# Patient Record
Sex: Female | Born: 1980 | Race: Black or African American | Hispanic: No | Marital: Single | Smoking: Never smoker
Health system: Southern US, Community
[De-identification: ages and names within clinical notes are randomized; demographics above are authoritative.]

## PROBLEM LIST (undated history)

## (undated) DIAGNOSIS — N83299 Other ovarian cyst, unspecified side: Secondary | ICD-10-CM

## (undated) DIAGNOSIS — I10 Essential (primary) hypertension: Secondary | ICD-10-CM

## (undated) DIAGNOSIS — N920 Excessive and frequent menstruation with regular cycle: Secondary | ICD-10-CM

## (undated) DIAGNOSIS — N809 Endometriosis, unspecified: Secondary | ICD-10-CM

## (undated) DIAGNOSIS — E8881 Metabolic syndrome: Secondary | ICD-10-CM

## (undated) DIAGNOSIS — E88819 Insulin resistance, unspecified: Secondary | ICD-10-CM

## (undated) DIAGNOSIS — IMO0002 Reserved for concepts with insufficient information to code with codable children: Secondary | ICD-10-CM

## (undated) DIAGNOSIS — D649 Anemia, unspecified: Secondary | ICD-10-CM

## (undated) HISTORY — DX: Insulin resistance, unspecified: E88.819

## (undated) HISTORY — DX: Endometriosis, unspecified: N80.9

## (undated) HISTORY — DX: Metabolic syndrome: E88.81

## (undated) HISTORY — DX: Anemia, unspecified: D64.9

## (undated) HISTORY — DX: Other ovarian cyst, unspecified side: N83.299

## (undated) HISTORY — DX: Reserved for concepts with insufficient information to code with codable children: IMO0002

---

## 2002-12-12 ENCOUNTER — Other Ambulatory Visit: Admission: RE | Admit: 2002-12-12 | Discharge: 2002-12-12 | Payer: Self-pay | Admitting: Obstetrics and Gynecology

## 2003-01-03 HISTORY — PX: OVARIAN CYST REMOVAL: SHX89

## 2003-04-06 ENCOUNTER — Ambulatory Visit (HOSPITAL_COMMUNITY): Admission: RE | Admit: 2003-04-06 | Discharge: 2003-04-06 | Payer: Self-pay | Admitting: Obstetrics and Gynecology

## 2003-04-13 ENCOUNTER — Ambulatory Visit (HOSPITAL_COMMUNITY): Admission: RE | Admit: 2003-04-13 | Discharge: 2003-04-13 | Payer: Self-pay | Admitting: Obstetrics and Gynecology

## 2003-05-19 ENCOUNTER — Ambulatory Visit (HOSPITAL_COMMUNITY): Admission: RE | Admit: 2003-05-19 | Discharge: 2003-05-19 | Payer: Self-pay | Admitting: Obstetrics and Gynecology

## 2004-01-03 HISTORY — PX: OTHER SURGICAL HISTORY: SHX169

## 2004-03-15 ENCOUNTER — Other Ambulatory Visit: Admission: RE | Admit: 2004-03-15 | Discharge: 2004-03-15 | Payer: Self-pay | Admitting: Obstetrics and Gynecology

## 2005-03-02 DIAGNOSIS — N809 Endometriosis, unspecified: Secondary | ICD-10-CM

## 2005-03-02 HISTORY — DX: Endometriosis, unspecified: N80.9

## 2005-03-20 ENCOUNTER — Other Ambulatory Visit: Admission: RE | Admit: 2005-03-20 | Discharge: 2005-03-20 | Payer: Self-pay | Admitting: Obstetrics and Gynecology

## 2005-11-25 IMAGING — CT CT ABDOMEN W/ CM
1 of 2 series · 14 of 32 positions shown, 18 images · IV contrast ([ID] READICAT & [ID] OMNI/300)
Comparison: none

CLINICAL DATA: Pelvic mass identified on recent ultrasound.  Evaluate.
CT OF THE ABDOMEN AND PELVIS WITH CONTRAST:
Following the administration of oral and 100 cc of Omnipaque 300 IV, spiral CT was utilized from the dome of the diaphragm to the pubic symphysis.  Both lung and soft tissue windows were obtained.  Delayed images to evaluate the kidneys and bladder filling were also obtained.
ABDOMEN CT:
The lung bases appear clear.  
The liver, spleen, pancreas, adrenal glands, gallbladder and kidneys all have a normal post-contrasted appearance.  Adequate proximal bowel contrast was achieved and no focal bowel abnormalities are noted.  No intraabdominal fluid or adenopathy is noted.  
IMPRESSION
Normal abdominal CT
PELVIC CT:
Good distal bowel contrast was achieved and no focal abnormalities are identified.  The finding of note is a pelvic mass identified which is located just superior and posterior to the uterine corpus.  This measures 9.2 x 4.6 x approximately 5.6 cm in size.  This has an appearance which correlates with the ultrasound finding of two primarily cystic areas with diffuse low level echoes and a central area which has higher attenuation.  No areas of high attenuation to suggest Intralesional blood are seen as might be helpful to confirm that this an atypical endometrioma.  No separate area is seen to suggest the left ovary and raises the possibility that this is ovarian in etiology.  No signs of perilesional inflammatory change are noted to suggest that this an inflammatory process.  No pelvic fluid or definite associated adenopathy is seen.  In the right adnexa the right ovary is seen and contains a small cystic area which would correlate with the ultrasound finding on this side on 04/06/03.  The tubular fluid-filled structure seen on ultrasound on the right side is not appreciated with confidence on the CT.  Bladder and distal ureters appear unremarkable.  
Pelvic mass with the epicenter located slightly to the left.  The position and lack of visualization of a left ovary suggests that this may be ovarian in etiology. The overall appearance correlates with recent ultrasound appearance.  No areas of increased attenuation are identified within this to suggest acute hemorrhage or acute blood as might be seen with an endometrioma or acute hemorrhagic cyst.  An area of higher attenuation in the central portion of the mass is seen and correlates with the area of increased echogenicity on the ultrasound.  It remains possible that this could represent an endometrioma with an area of more recent clot centrally and more typical chocolate cyst contents in the remainder of the mass.  An atypical hemorrhagic cyst would be a secondary consideration.  Lack of adjacent inflammatory changes would mitigate against this representing an inflammatory process or ovarian torsion.  An ovarian neoplastic process would certainly need to be considered as well.  If this indeed represents a neoplastic process, no signs to suggest extra-ovarian extension are apparent by CT criteria.

[Series 2: — · axial · 0.63mm/px · z∈[-504,-94]mm · 14 of 137 slices shown, 18 images]
[im 11/137  soft-tissue]
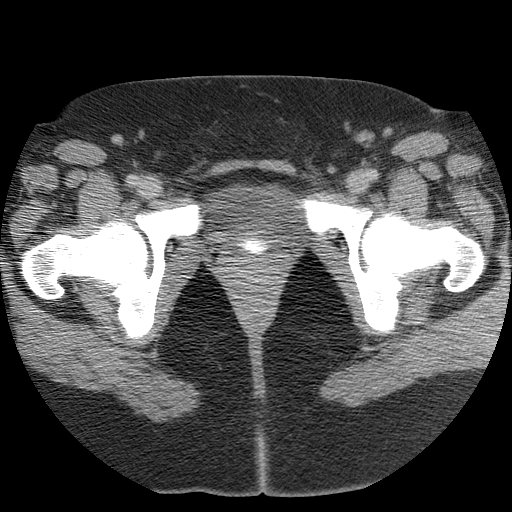
[im 11/137  bone]
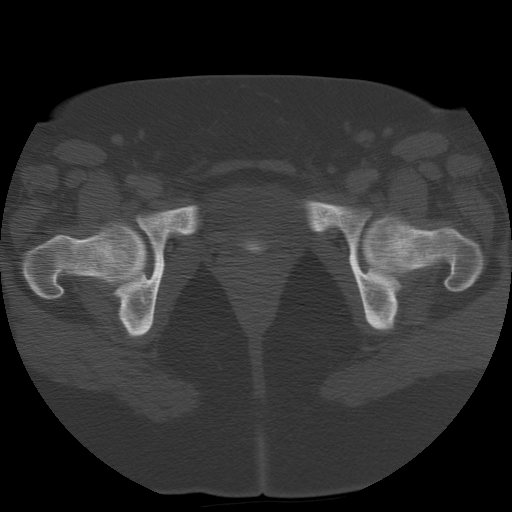
[im 21/137  soft-tissue]
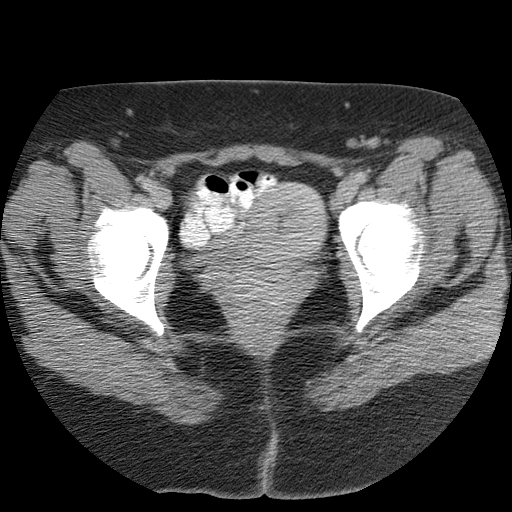
[im 31/137  soft-tissue]
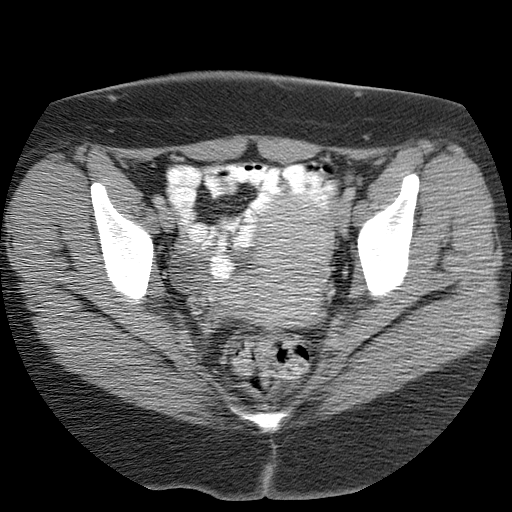
[im 41/137  soft-tissue]
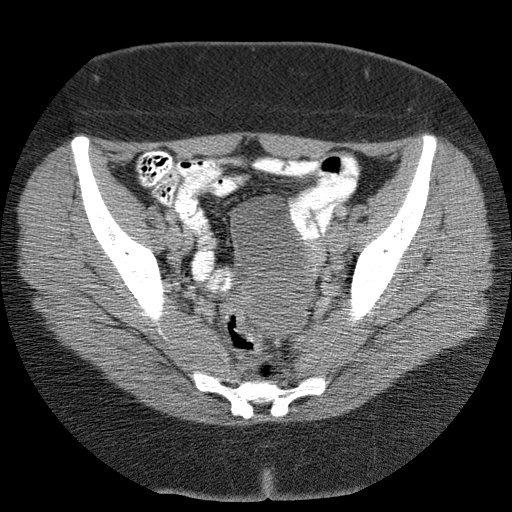
[im 51/137  soft-tissue]
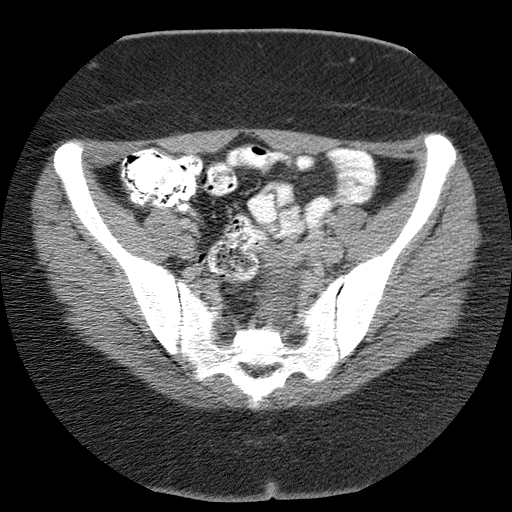
[im 61/137  soft-tissue]
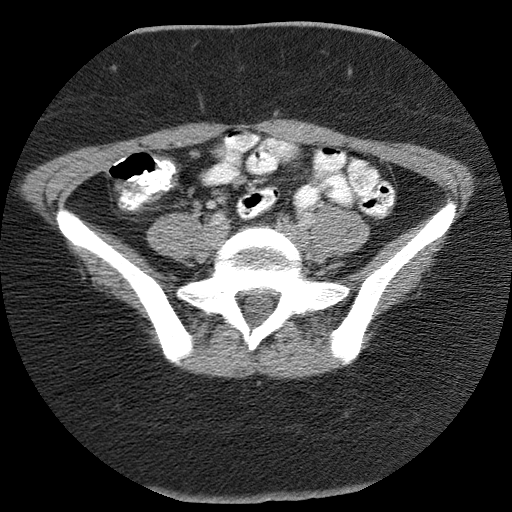
[im 76/137  soft-tissue]
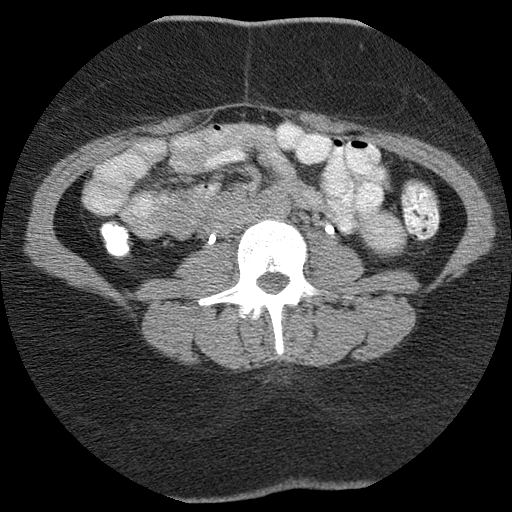
[im 86/137  soft-tissue]
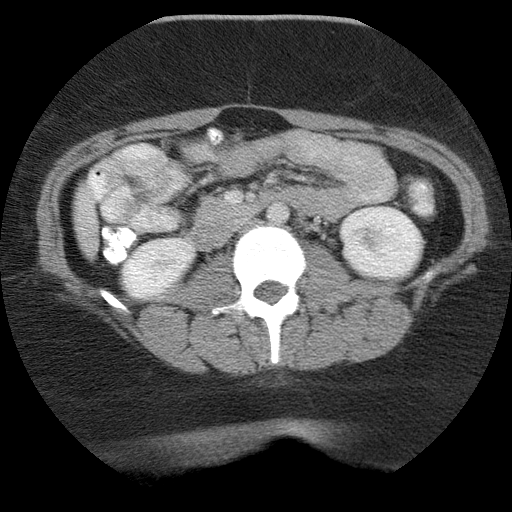
[im 96/137  soft-tissue]
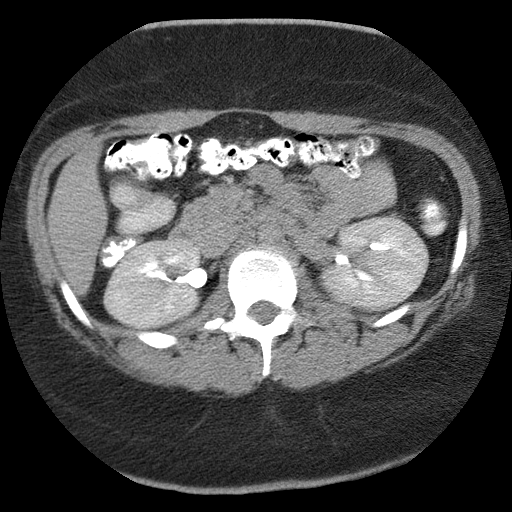
[im 96/137  bone]
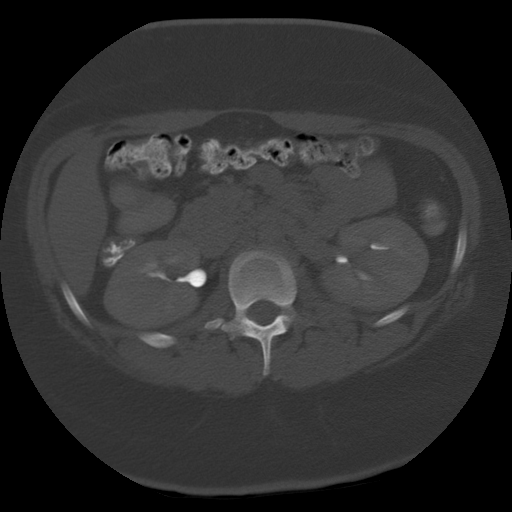
[im 106/137  soft-tissue]
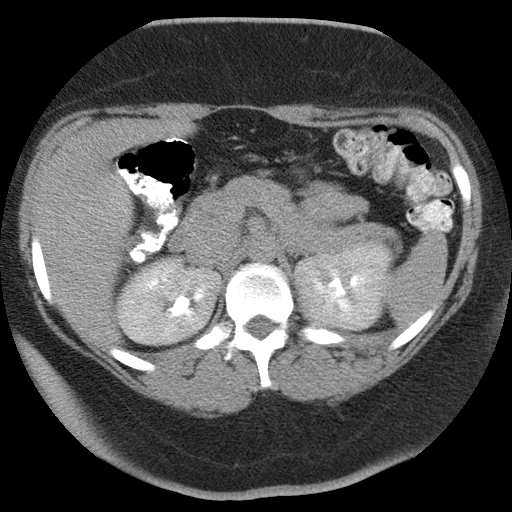
[im 116/137  soft-tissue]
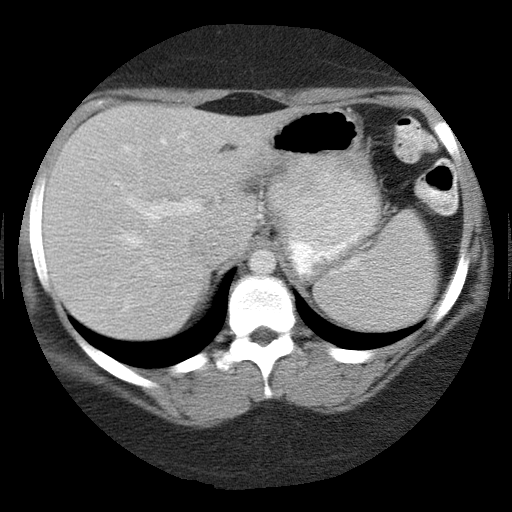
[im 116/137  lung]
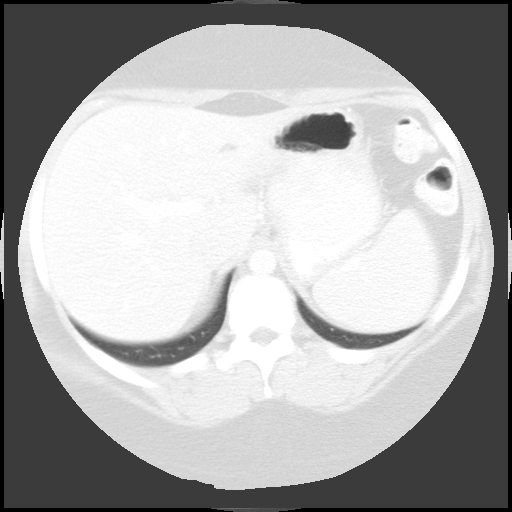
[im 121/137  lung]
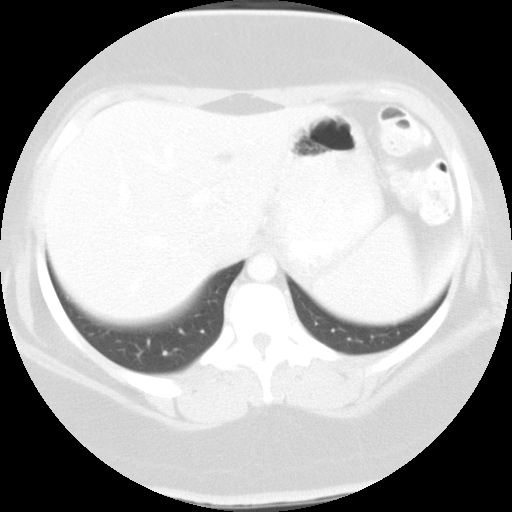
[im 126/137  soft-tissue]
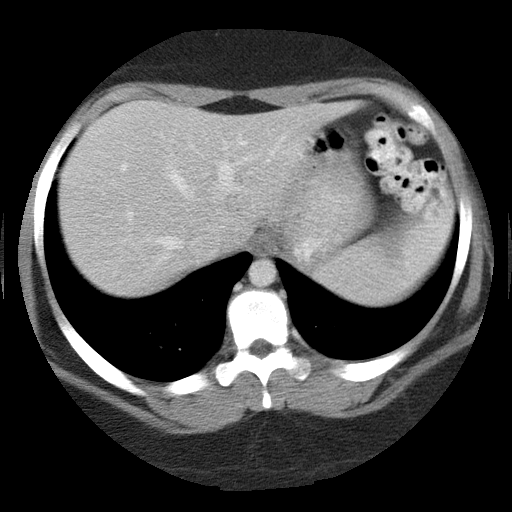
[im 126/137  lung]
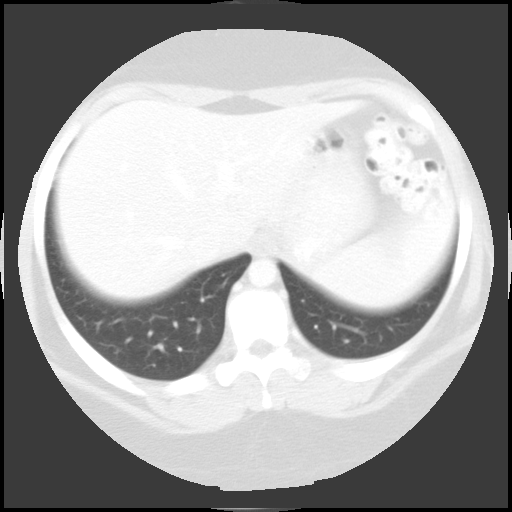
[im 131/137  lung]
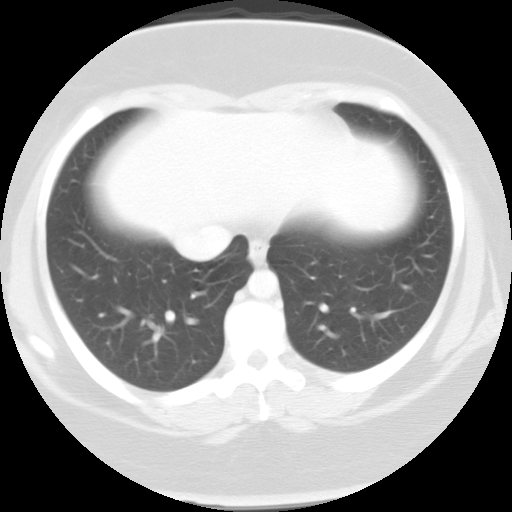

[14 of 32 positions shown; findings below may reference images not displayed]

## 2006-04-27 ENCOUNTER — Encounter: Admission: RE | Admit: 2006-04-27 | Discharge: 2006-04-27 | Payer: Self-pay | Admitting: Obstetrics and Gynecology

## 2009-03-02 DIAGNOSIS — IMO0002 Reserved for concepts with insufficient information to code with codable children: Secondary | ICD-10-CM

## 2009-03-02 DIAGNOSIS — R87619 Unspecified abnormal cytological findings in specimens from cervix uteri: Secondary | ICD-10-CM

## 2009-03-02 HISTORY — DX: Unspecified abnormal cytological findings in specimens from cervix uteri: R87.619

## 2009-03-02 HISTORY — DX: Reserved for concepts with insufficient information to code with codable children: IMO0002

## 2011-08-15 ENCOUNTER — Ambulatory Visit (INDEPENDENT_AMBULATORY_CARE_PROVIDER_SITE_OTHER): Payer: BC Managed Care – PPO | Admitting: Obstetrics and Gynecology

## 2011-08-15 ENCOUNTER — Encounter: Payer: Self-pay | Admitting: Obstetrics and Gynecology

## 2011-08-15 VITALS — BP 122/64 | HR 70 | Resp 16 | Ht 74.0 in | Wt 310.0 lb

## 2011-08-15 DIAGNOSIS — N809 Endometriosis, unspecified: Secondary | ICD-10-CM

## 2011-08-15 DIAGNOSIS — R87811 Vaginal high risk human papillomavirus (HPV) DNA test positive: Secondary | ICD-10-CM

## 2011-08-15 DIAGNOSIS — IMO0002 Reserved for concepts with insufficient information to code with codable children: Secondary | ICD-10-CM

## 2011-08-15 DIAGNOSIS — Z124 Encounter for screening for malignant neoplasm of cervix: Secondary | ICD-10-CM

## 2011-08-15 MED ORDER — LEVONORGEST-ETH ESTRAD 91-DAY 0.15-0.03 MG PO TABS: 1.0000 | ORAL_TABLET | Freq: Every day | ORAL | Status: DC

## 2011-08-15 NOTE — Patient Instructions (Signed)

## 2011-08-15 NOTE — Progress Notes (Signed)
Last Pap: 07/13/2010 WNL: Yes Regular Periods:yes Contraception: BC Pill  Monthly Breast exam:yes Tetanus<71yrs:yes Nl.Bladder Function:yes Daily BMs:no Healthy Diet:no Calcium:no Mammogram:no Date of Mammogram: NA Exercise:no Have often Exercise: No Seatbelt: yes Abuse at home: no Stressful work:yes Sigmoid-colonoscopy: None Bone Density: No PCP: Dr Allyne Gee Change in PMH: no change Change in FMH:no change BP 122/64  Pulse 70  Resp 16  Ht 6\' 2"  (1.88 m)  Wt 310 lb (140.615 kg)  BMI 39.80 kg/m2  LMP 05/26/2011 Pt without complaints Physical Examination: General appearance - alert, well appearing, and in no distress Mental status - normal mood, behavior, speech, dress, motor activity, and thought processes Neck - supple, no significant adenopathy, thyroid exam: thyroid is normal in size without nodules or tenderness Chest - clear to auscultation, no wheezes, rales or rhonchi, symmetric air entry Heart - normal rate and regular rhythm Abdomen - soft, nontender, nondistended, no masses or organomegaly Breasts - breasts appear normal, no suspicious masses, no skin or nipple changes or axillary nodes Pelvic - normal external genitalia, vulva, vagina, cervix, uterus and adnexa Rectal - normal rectal, no masses, rectal exam not indicated Back exam - full range of motion, no tenderness, palpable spasm or pain on motion Neurological - alert, oriented, normal speech, no focal findings or movement disorder noted Musculoskeletal - no joint tenderness, deformity or swelling Extremities - no edema, redness or tenderness in the calves or thighs Skin - normal coloration and turgor, no rashes, no suspicious skin lesions noted Routine exam Pap sent yes Mammogram due no rx for seasonale sent to pharmacy RT 1 yr

## 2011-08-17 LAB — PAP IG W/ RFLX HPV ASCU

## 2011-08-28 ENCOUNTER — Telehealth: Payer: Self-pay

## 2011-08-28 MED ORDER — METRONIDAZOLE 500 MG PO TABS: 500.0000 mg | ORAL_TABLET | Freq: Two times a day (BID) | ORAL | Status: AC

## 2011-08-28 NOTE — Telephone Encounter (Signed)
Spoke with pt rgs labs informed pap wnl but showed bv need rx informed rx sent to pharm pt voice understanding

## 2011-08-28 NOTE — Telephone Encounter (Signed)
Message copied by Rolla Plate on Mon Aug 28, 2011  9:49 AM ------      Message from: Jaymes Graff      Created: Fri Aug 25, 2011 10:55 PM       Please tell pt BV was found on her pap smear.  She can be treated with either Metrogel one applicator in vagina QHS for five nights or Flagyl 500mg  one tablet twice a day for seven days.

## 2012-02-24 ENCOUNTER — Other Ambulatory Visit: Payer: Self-pay | Admitting: Obstetrics and Gynecology

## 2013-06-11 ENCOUNTER — Ambulatory Visit (INDEPENDENT_AMBULATORY_CARE_PROVIDER_SITE_OTHER): Payer: BC Managed Care – PPO | Admitting: Emergency Medicine

## 2013-06-11 VITALS — BP 140/90 | HR 84 | Temp 98.0°F | Ht 73.5 in | Wt 336.0 lb

## 2013-06-11 DIAGNOSIS — H6120 Impacted cerumen, unspecified ear: Secondary | ICD-10-CM

## 2013-06-11 DIAGNOSIS — H60399 Other infective otitis externa, unspecified ear: Secondary | ICD-10-CM

## 2013-06-11 DIAGNOSIS — H9209 Otalgia, unspecified ear: Secondary | ICD-10-CM

## 2013-06-11 MED ORDER — NEOMYCIN-POLYMYXIN-HC 3.5-10000-1 OT SOLN: 4.0000 [drp] | Freq: Four times a day (QID) | OTIC | Status: DC

## 2013-06-11 NOTE — Progress Notes (Signed)
[facility] [address] [place] [address] [phone]  Date:  06/11/2013   Name:  Adrienne Benjamin   DOB:  1980-08-07   MRN:  [id number]  PCP:  [name], MD    Chief Complaint: left ear pain   History of Present Illness:  Adrienne Benjamin is a 33 y.o. very pleasant female patient who presents with the following:  Saturday developed pain in the left ear.  Has instrumented her ear since and the pain is worse since.  No fever or chills.  No coryza or cough.  Now has a sore throat.  No improvement with over the counter medications or other home remedies. Denies other complaint or health concern today.   Patient Active Problem List   Diagnosis Date Noted  . Endometriosis 08/15/2011  . 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 Abnormal Pap smear 3/11    ascus/ hpv  . Endometriosis 3/07  . Anemia   . Complex ovarian cyst   . Insulin resistance     No past surgical history on file.  History  Substance Use Topics  . Smoking status: Never Smoker   . Smokeless tobacco: Not on file  . Alcohol Use: No    Family History  Problem Relation Age of Onset  . Diabetes Father     No Known Allergies  Medication list has been reviewed and updated.  Current Outpatient Prescriptions on File Prior to Visit  Medication Sig Dispense Refill  . ferrous sulfate 325 (65 FE) MG tablet Take 325 mg by mouth daily with breakfast.      . folic acid (FOLVITE) 1 MG tablet TAKE ONE TABLET BY MOUTH EVERY DAY  30 tablet  0  . hydrochlorothiazide (MICROZIDE) 12.5 MG capsule Take 12.5 mg by mouth daily.      Marland Kitchen levocetirizine (XYZAL) 5 MG tablet Take 5 mg by mouth every evening.      Marland Kitchen levonorgestrel-ethinyl estradiol (SEASONALE,INTROVALE,JOLESSA) 0.15-0.03 MG tablet Take 1 tablet by mouth daily.  1 Package  4  . phentermine 37.5 MG capsule Take 37.5 mg by mouth every morning.      . Prenatal Vit-Fe Fumarate-FA (PRENATAL  MULTIVITAMIN) TABS Take 1 tablet by mouth daily.       No current facility-administered medications on file prior to visit.    Review of Systems:  As per HPI, otherwise negative.    Physical Examination: Filed Vitals:   06/11/13 1951  BP: 140/90  Pulse: 84  Temp: 98 F (36.7 C)   Filed Vitals:   06/11/13 1951  Height: 6' 1.5" (1.867 m)  Weight: 336 lb (152.409 kg)   Body mass index is 43.72 kg/(m^2). Ideal Body Weight: Weight in (lb) to have BMI = 25: 191.7   GEN: WDWN, NAD, Non-toxic, Alert & Oriented x 3 HEENT: Atraumatic, Normocephalic.  Ears and Nose: No external deformity.  Both ears exhibit excessive cerumen EXTR: No clubbing/cyanosis/edema NEURO: Normal gait.  PSYCH: Normally interactive. Conversant. Not depressed or anxious appearing.  Calm demeanor.    Assessment and Plan: Swimmers ear Excessive cerumen Cortisporin   Signed,  Phillips Odor, MD

## 2013-06-11 NOTE — Patient Instructions (Signed)
Otitis Externa Otitis externa is a bacterial or fungal infection of the outer ear canal. This is the area from the eardrum to the outside of the ear. Otitis externa is sometimes called "swimmer's ear." CAUSES  Possible causes of infection include:  Swimming in dirty water.  Moisture remaining in the ear after swimming or bathing.  Mild injury (trauma) to the ear.  Objects stuck in the ear (foreign body).  Cuts or scrapes (abrasions) on the outside of the ear. SYMPTOMS  The first symptom of infection is often itching in the ear canal. Later signs and symptoms may include swelling and redness of the ear canal, ear pain, and yellowish-white fluid (pus) coming from the ear. The ear pain may be worse when pulling on the earlobe. DIAGNOSIS  Your caregiver will perform a physical exam. A sample of fluid may be taken from the ear and examined for bacteria or fungi. TREATMENT  Antibiotic ear drops are often given for 10 to 14 days. Treatment may also include pain medicine or corticosteroids to reduce itching and swelling. PREVENTION   Keep your ear dry. Use the corner of a towel to absorb water out of the ear canal after swimming or bathing.  Avoid scratching or putting objects inside your ear. This can damage the ear canal or remove the protective wax that lines the canal. This makes it easier for bacteria and fungi to grow.  Avoid swimming in lakes, polluted water, or poorly chlorinated pools.  You may use ear drops made of rubbing alcohol and vinegar after swimming. Combine equal parts of white vinegar and alcohol in a bottle. Put 3 or 4 drops into each ear after swimming. HOME CARE INSTRUCTIONS   Apply antibiotic ear drops to the ear canal as prescribed by your caregiver.  Only take over-the-counter or prescription medicines for pain, discomfort, or fever as directed by your caregiver.  If you have diabetes, follow any additional treatment instructions from your caregiver.  Keep all  follow-up appointments as directed by your caregiver. SEEK MEDICAL CARE IF:   You have a fever.  Your ear is still red, swollen, painful, or draining pus after 3 days.  Your redness, swelling, or pain gets worse.  You have a severe headache.  You have redness, swelling, pain, or tenderness in the area behind your ear. MAKE SURE YOU:   Understand these instructions.  Will watch your condition.  Will get help right away if you are not doing well or get worse. Document Released: 12/19/2004 Document Revised: 03/13/2011 Document Reviewed: 01/05/2011 [employer] Patient Information 2014 [place], [place]. Cerumen Impaction A cerumen impaction is when the wax in your ear forms a plug. This plug usually causes reduced hearing. Sometimes it also causes an earache or dizziness. Removing a cerumen impaction can be difficult and painful. The wax sticks to the ear canal. The canal is sensitive and bleeds easily. If you try to remove a heavy wax buildup with a cotton tipped swab, you may push it in further. Irrigation with water, suction, and small ear curettes may be used to clear out the wax. If the impaction is fixed to the skin in the ear canal, ear drops may be needed for a few days to loosen the wax. People who build up a lot of wax frequently can use ear wax removal products available in your local drugstore. SEEK MEDICAL CARE IF:  You develop an earache, increased hearing loss, or marked dizziness. Document Released: 01/27/2004 Document Revised: 03/13/2011 Document Reviewed: 03/18/2009 [employer] Patient  Information 2014 [employer].  

## 2017-09-19 LAB — HM PAP SMEAR

## 2017-10-01 DIAGNOSIS — T8130XS Disruption of wound, unspecified, sequela: Secondary | ICD-10-CM

## 2017-10-29 ENCOUNTER — Encounter (HOSPITAL_BASED_OUTPATIENT_CLINIC_OR_DEPARTMENT_OTHER): Payer: Self-pay | Attending: Internal Medicine

## 2017-12-03 ENCOUNTER — Other Ambulatory Visit: Payer: Self-pay | Admitting: Internal Medicine

## 2018-03-27 ENCOUNTER — Encounter: Payer: Self-pay | Admitting: Internal Medicine

## 2018-05-24 ENCOUNTER — Encounter: Payer: Self-pay | Admitting: Internal Medicine

## 2018-06-30 ENCOUNTER — Other Ambulatory Visit: Payer: Self-pay | Admitting: Internal Medicine

## 2018-09-16 ENCOUNTER — Other Ambulatory Visit: Payer: Self-pay | Admitting: Internal Medicine

## 2018-09-16 ENCOUNTER — Encounter: Payer: Self-pay | Admitting: Internal Medicine

## 2018-09-16 ENCOUNTER — Ambulatory Visit (INDEPENDENT_AMBULATORY_CARE_PROVIDER_SITE_OTHER): Payer: BC Managed Care – PPO | Admitting: Internal Medicine

## 2018-09-16 ENCOUNTER — Other Ambulatory Visit: Payer: Self-pay

## 2018-09-16 VITALS — BP 148/80 | HR 78 | Temp 98.5°F | Ht 72.0 in | Wt 367.8 lb

## 2018-09-16 DIAGNOSIS — I10 Essential (primary) hypertension: Secondary | ICD-10-CM

## 2018-09-16 DIAGNOSIS — H6123 Impacted cerumen, bilateral: Secondary | ICD-10-CM | POA: Diagnosis not present

## 2018-09-16 DIAGNOSIS — Z0000 Encounter for general adult medical examination without abnormal findings: Secondary | ICD-10-CM

## 2018-09-16 MED ORDER — NIFEDIPINE ER OSMOTIC RELEASE 30 MG PO TB24: 30.0000 mg | ORAL_TABLET | Freq: Every day | ORAL | 1 refills | Status: DC

## 2018-09-16 NOTE — Progress Notes (Signed)
Subjective:     Patient ID: Adrienne Benjamin , female    DOB: 1980-02-15 , 38 y.o.   MRN: [id number]   Chief Complaint  Patient presents with  . Annual Exam    HPI  She is here today for a full physical examination. She has her pap smears performed by Dr. Charlesetta Garibaldi. She is scheduled for her next pap smear next week. She is currently trying to conceive. She is taking prenatal vitamins.     Past Medical History:  Diagnosis Date  . Abnormal Pap smear 3/11   ascus/ hpv  . Anemia   . Complex ovarian cyst   . Endometriosis 3/07  . Insulin resistance      Family History  Problem Relation Age of Onset  . Diabetes Father      Current Outpatient Medications:  .  hydrochlorothiazide (MICROZIDE) 12.5 MG capsule, Take 1 capsule by mouth once daily, Disp: 90 capsule, Rfl: 0 .  Iron-Folic DHRC-B63-A-GTXMIWOE (FERRAPLUS 90) 90-1 MG TABS, TAKE 1 TABLET BY MOUTH ONCE DAILY, Disp: 10 tablet, Rfl: 5 .  levocetirizine (XYZAL) 5 MG tablet, Take 5 mg by mouth every evening., Disp: , Rfl:  .  neomycin-polymyxin-hydrocortisone (CORTISPORIN) otic solution, Place 4 drops into the left ear 4 (four) times daily., Disp: 10 mL, Rfl: 0 .  Nutritional Supplements (CONCEPTIONXR REPRODUCTIVE) TABS, Take by mouth., Disp: , Rfl:  .  Prenatal Vit-Fe Fumarate-FA (PRENATAL MULTIVITAMIN) TABS, Take 1 tablet by mouth daily., Disp: , Rfl:  .  ferrous sulfate 325 (65 FE) MG tablet, Take 325 mg by mouth daily with breakfast., Disp: , Rfl:  .  folic acid (FOLVITE) 1 MG tablet, TAKE ONE TABLET BY MOUTH EVERY DAY (Patient not taking: Reported on 09/16/2018), Disp: 30 tablet, Rfl: 0 .  NIFEdipine (PROCARDIA-XL/NIFEDICAL-XL) 30 MG 24 hr tablet, Take 1 tablet (30 mg total) by mouth daily., Disp: 30 tablet, Rfl: 1   No Known Allergies     The patient states she uses none for birth control. Last LMP was Patient's last menstrual period was 09/14/2018 (exact date).. Negative for Dysmenorrhea  Negative for: breast discharge,  breast lump(s), breast pain and breast self exam. Associated symptoms include abnormal vaginal bleeding. Pertinent negatives include abnormal bleeding (hematology), anxiety, decreased libido, depression, difficulty falling sleep, dyspareunia, history of infertility, nocturia, sexual dysfunction, sleep disturbances, urinary incontinence, urinary urgency, vaginal discharge and vaginal itching. Diet regular.The patient states her exercise level is  intermittent.  . The patient's tobacco use is:  Social History   Tobacco Use  Smoking Status Never Smoker  Smokeless Tobacco Never Used  . She has been exposed to passive smoke. The patient's alcohol use is:  Social History   Substance and Sexual Activity  Alcohol Use No     Review of Systems  Constitutional: Negative.   HENT: Negative.   Eyes: Negative.   Respiratory: Negative.   Cardiovascular: Negative.   Endocrine: Negative.   Genitourinary: Negative.   Musculoskeletal: Negative.   Skin: Negative.   Allergic/Immunologic: Negative.   Neurological: Negative.   Hematological: Negative.   Psychiatric/Behavioral: Negative.      Today's Vitals   09/16/18 1021  BP: (!) 148/80  Pulse: 78  Temp: 98.5 F (36.9 C)  TempSrc: Oral  SpO2: 97%  Weight: (!) 367 lb 12.8 oz (166.8 kg)  Height: 6' (1.829 m)   Body mass index is 49.88 kg/m.   Objective:  Physical Exam Vitals signs and nursing note reviewed.  Constitutional:      Appearance:  Normal appearance. She is obese.  HENT:     Head: Normocephalic and atraumatic.     Right Ear: Ear canal and external ear normal. There is impacted cerumen.     Left Ear: Ear canal and external ear normal. There is impacted cerumen.     Ears:     Comments: Cerumen is hardened and deep into the canal.     Nose: Nose normal.     Mouth/Throat:     Mouth: Mucous membranes are moist.     Pharynx: Oropharynx is clear.  Eyes:     Extraocular Movements: Extraocular movements intact.      Conjunctiva/sclera: Conjunctivae normal.     Pupils: Pupils are equal, round, and reactive to light.  Neck:     Musculoskeletal: Normal range of motion and neck supple.  Cardiovascular:     Rate and Rhythm: Normal rate and regular rhythm.     Pulses: Normal pulses.     Heart sounds: Normal heart sounds.  Pulmonary:     Effort: Pulmonary effort is normal.     Breath sounds: Normal breath sounds.  Chest:     Breasts: Tanner Score is 5.        Right: Normal. No swelling, bleeding, inverted nipple, mass, nipple discharge or skin change.        Left: Normal. No swelling, bleeding, inverted nipple, mass, nipple discharge or skin change.  Abdominal:     General: Bowel sounds are normal.     Palpations: Abdomen is soft.     Comments: Obese, difficult to assess organomegaly  Genitourinary:    Comments: deferred Musculoskeletal: Normal range of motion.  Skin:    General: Skin is warm and dry.  Neurological:     General: No focal deficit present.     Mental Status: She is alert and oriented to person, place, and time.  Psychiatric:        Mood and Affect: Mood normal.        Behavior: Behavior normal.         Assessment And Plan:     1. Routine general medical examination at health care facility  A full exam was performed.  Importance of monthly self breast exams was discussed with the patient. PATIENT HAS BEEN ADVISED TO GET 30-45 MINUTES REGULAR EXERCISE NO LESS THAN FOUR TO FIVE DAYS PER WEEK - BOTH WEIGHTBEARING EXERCISES AND AEROBIC ARE RECOMMENDED.  SHE WAS ADVISED TO FOLLOW A HEALTHY DIET WITH AT LEAST SIX FRUITS/VEGGIES PER DAY, DECREASE INTAKE OF RED MEAT, AND TO INCREASE FISH INTAKE TO TWO DAYS PER WEEK.  MEATS/FISH SHOULD NOT BE FRIED, BAKED OR BROILED IS PREFERABLE.  I SUGGEST WEARING SPF 50 SUNSCREEN ON EXPOSED PARTS AND ESPECIALLY WHEN IN THE DIRECT SUNLIGHT FOR AN EXTENDED PERIOD OF TIME.  PLEASE AVOID FAST FOOD RESTAURANTS AND INCREASE YOUR WATER INTAKE.  - CMP14+EGFR -  CBC - Lipid panel - Hemoglobin A1c - TSH  2. Essential hypertension, benign  Chronic, uncontrolled. She admits that she has not been taking regularly. Importance of medication compliance was discussed with the patient. I will also add nifedipine XL 30 mg once daily. She agrees to rto in six weeks for re-evaluation. EKG performed, no new changes noted. She is also advised to stop eating luncheon meats, increase her daily activity and to avoid adding salt to her foods.   - EKG 12-Lead  3. Bilateral impacted cerumen  AFTER OBTAINING VERBAL CONSENT, BOTH EARS WERE FLUSHED BY IRRIGATION. SHE TOLERATED PROCEDURE WELL  WITHOUT ANY COMPLICATIONS. NO TM ABNORMALITIES WERE NOTED.  - Ear Lavage        [name], MD    THE PATIENT IS ENCOURAGED TO PRACTICE SOCIAL DISTANCING DUE TO THE COVID-19 PANDEMIC.

## 2018-09-16 NOTE — Patient Instructions (Signed)
Health Maintenance, Female Adopting a healthy lifestyle and getting preventive care are important in promoting health and wellness. Ask your health care provider about:  The right schedule for you to have regular tests and exams.  Things you can do on your own to prevent diseases and keep yourself healthy. What should I know about diet, weight, and exercise? Eat a healthy diet   Eat a diet that includes plenty of vegetables, fruits, low-fat dairy products, and lean protein.  Do not eat a lot of foods that are high in solid fats, added sugars, or sodium. Maintain a healthy weight Body mass index (BMI) is used to identify weight problems. It estimates body fat based on height and weight. Your health care provider can help determine your BMI and help you achieve or maintain a healthy weight. Get regular exercise Get regular exercise. This is one of the most important things you can do for your health. Most adults should:  Exercise for at least 150 minutes each week. The exercise should increase your heart rate and make you sweat (moderate-intensity exercise).  Do strengthening exercises at least twice a week. This is in addition to the moderate-intensity exercise.  Spend less time sitting. Even light physical activity can be beneficial. Watch cholesterol and blood lipids Have your blood tested for lipids and cholesterol at 37 years of age, then have this test every 5 years. Have your cholesterol levels checked more often if:  Your lipid or cholesterol levels are high.  You are older than 37 years of age.  You are at high risk for heart disease. What should I know about cancer screening? Depending on your health history and family history, you may need to have cancer screening at various ages. This may include screening for:  Breast cancer.  Cervical cancer.  Colorectal cancer.  Skin cancer.  Lung cancer. What should I know about heart disease, diabetes, and high blood  pressure? Blood pressure and heart disease  High blood pressure causes heart disease and increases the risk of stroke. This is more likely to develop in people who have high blood pressure readings, are of African descent, or are overweight.  Have your blood pressure checked: ? Every 3-5 years if you are 18-39 years of age. ? Every year if you are 40 years old or older. Diabetes Have regular diabetes screenings. This checks your fasting blood sugar level. Have the screening done:  Once every three years after age 40 if you are at a normal weight and have a low risk for diabetes.  More often and at a younger age if you are overweight or have a high risk for diabetes. What should I know about preventing infection? Hepatitis B If you have a higher risk for hepatitis B, you should be screened for this virus. Talk with your health care provider to find out if you are at risk for hepatitis B infection. Hepatitis C Testing is recommended for:  Everyone born from 1945 through 1965.  Anyone with known risk factors for hepatitis C. Sexually transmitted infections (STIs)  Get screened for STIs, including gonorrhea and chlamydia, if: ? You are sexually active and are younger than 37 years of age. ? You are older than 37 years of age and your health care provider tells you that you are at risk for this type of infection. ? Your sexual activity has changed since you were last screened, and you are at increased risk for chlamydia or gonorrhea. Ask your health care provider if   you are at risk.  Ask your health care provider about whether you are at high risk for HIV. Your health care provider may recommend a prescription medicine to help prevent HIV infection. If you choose to take medicine to prevent HIV, you should first get tested for HIV. You should then be tested every 3 months for as long as you are taking the medicine. Pregnancy  If you are about to stop having your period (premenopausal) and  you may become pregnant, seek counseling before you get pregnant.  Take 400 to 800 micrograms (mcg) of folic acid every day if you become pregnant.  Ask for birth control (contraception) if you want to prevent pregnancy. Osteoporosis and menopause Osteoporosis is a disease in which the bones lose minerals and strength with aging. This can result in bone fractures. If you are 65 years old or older, or if you are at risk for osteoporosis and fractures, ask your health care provider if you should:  Be screened for bone loss.  Take a calcium or vitamin D supplement to lower your risk of fractures.  Be given hormone replacement therapy (HRT) to treat symptoms of menopause. Follow these instructions at home: Lifestyle  Do not use any products that contain nicotine or tobacco, such as cigarettes, e-cigarettes, and chewing tobacco. If you need help quitting, ask your health care provider.  Do not use street drugs.  Do not share needles.  Ask your health care provider for help if you need support or information about quitting drugs. Alcohol use  Do not drink alcohol if: ? Your health care provider tells you not to drink. ? You are pregnant, may be pregnant, or are planning to become pregnant.  If you drink alcohol: ? Limit how much you use to 0-1 drink a day. ? Limit intake if you are breastfeeding.  Be aware of how much alcohol is in your drink. In the [place] one drink equals one 12 oz bottle of beer (355 mL), one 5 oz glass of wine (148 mL), or one 1 oz glass of hard liquor (44 mL). General instructions  Schedule regular health, dental, and eye exams.  Stay current with your vaccines.  Tell your health care provider if: ? You often feel depressed. ? You have ever been abused or do not feel safe at home. Summary  Adopting a healthy lifestyle and getting preventive care are important in promoting health and wellness.  Follow your health care provider's instructions about healthy  diet, exercising, and getting tested or screened for diseases.  Follow your health care provider's instructions on monitoring your cholesterol and blood pressure. This information is not intended to replace advice given to you by your health care provider. Make sure you discuss any questions you have with your health care provider. Document Released: 07/04/2010 Document Revised: 12/12/2017 Document Reviewed: 12/12/2017 Elsevier Patient Education  2020 [employer]  

## 2018-09-17 LAB — COMPLETE METABOLIC PANEL WITH GFR
AG Ratio: 1.1 (calc) (ref 1.0–2.5)
ALT: 8 U/L (ref 6–29)
AST: 14 U/L (ref 10–30)
Albumin: 4 g/dL (ref 3.6–5.1)
Alkaline phosphatase (APISO): 91 U/L (ref 31–125)
BUN: 8 mg/dL (ref 7–25)
CO2: 30 mmol/L (ref 20–32)
Calcium: 9.6 mg/dL (ref 8.6–10.2)
Chloride: 103 mmol/L (ref 98–110)
Creat: 0.61 mg/dL (ref 0.50–1.10)
GFR, Est African American: 134 mL/min/{1.73_m2} (ref >=60)
GFR, Est Non African American: 116 mL/min/{1.73_m2} (ref >=60)
Globulin: 3.5 g/dL (calc) (ref 1.9–3.7)
Glucose, Bld: 95 mg/dL (ref 65–99)
Potassium: 4 mmol/L (ref 3.5–5.3)
Sodium: 140 mmol/L (ref 135–146)
Total Bilirubin: 0.6 mg/dL (ref 0.2–1.2)
Total Protein: 7.5 g/dL (ref 6.1–8.1)

## 2018-09-17 LAB — CBC
HCT: 37.7 % (ref 35.0–45.0)
Hemoglobin: 11.7 g/dL (ref 11.7–15.5)
MCH: 22.4 pg — ABNORMAL LOW (ref 27.0–33.0)
MCHC: 31 g/dL — ABNORMAL LOW (ref 32.0–36.0)
MCV: 72.2 fL — ABNORMAL LOW (ref 80.0–100.0)
MPV: 10.4 fL (ref 7.5–12.5)
Platelets: 337 10*3/uL (ref 140–400)
RBC: 5.22 10*6/uL — ABNORMAL HIGH (ref 3.80–5.10)
RDW: 17.6 % — ABNORMAL HIGH (ref 11.0–15.0)
WBC: 4.2 10*3/uL (ref 3.8–10.8)

## 2018-09-17 LAB — HEMOGLOBIN A1C W/OUT EAG: Hgb A1c MFr Bld: 6.1 % of total Hgb — ABNORMAL HIGH (ref <5.7)

## 2018-09-17 LAB — LIPID PANEL W/REFLEX DIRECT LDL
Cholesterol: 154 mg/dL (ref <200)
HDL: 60 mg/dL (ref >=50)
LDL Cholesterol (Calc): 81 mg/dL (calc)
Non-HDL Cholesterol (Calc): 94 mg/dL (calc) (ref <130)
Total CHOL/HDL Ratio: 2.6 (calc) (ref <5.0)
Triglycerides: 58 mg/dL (ref <150)

## 2018-09-17 LAB — TSH: TSH: 2 mIU/L

## 2018-09-18 ENCOUNTER — Encounter: Payer: Self-pay | Admitting: Internal Medicine

## 2018-10-21 ENCOUNTER — Other Ambulatory Visit: Payer: Self-pay | Admitting: Internal Medicine

## 2018-10-31 ENCOUNTER — Other Ambulatory Visit: Payer: Self-pay

## 2018-10-31 ENCOUNTER — Ambulatory Visit (INDEPENDENT_AMBULATORY_CARE_PROVIDER_SITE_OTHER): Payer: BC Managed Care – PPO | Admitting: Internal Medicine

## 2018-10-31 ENCOUNTER — Encounter: Payer: Self-pay | Admitting: Internal Medicine

## 2018-10-31 VITALS — BP 132/86 | HR 80 | Temp 98.4°F | Ht 72.0 in | Wt 375.0 lb

## 2018-10-31 DIAGNOSIS — I10 Essential (primary) hypertension: Secondary | ICD-10-CM

## 2018-10-31 DIAGNOSIS — Z6841 Body Mass Index (BMI) 40.0 and over, adult: Secondary | ICD-10-CM

## 2018-10-31 NOTE — Patient Instructions (Signed)

## 2018-10-31 NOTE — Progress Notes (Signed)
Subjective:     Patient ID: Adrienne Benjamin , female    DOB: 1980/11/23 , 37 y.o.   MRN: [id number]   Chief Complaint  Patient presents with  . Hypertension    HPI  She is here today for bp check. She was started on nifedipine ER 30mg  at her last visit. She reports she initially "felt bad" when she started the medication; however, she is now tolerating the medication better. She has been taking every night. She is not yet exercising regularly.     Past Medical History:  Diagnosis Date  . Abnormal Pap smear 3/11   ascus/ hpv  . Anemia   . Complex ovarian cyst   . Endometriosis 3/07  . Insulin resistance      Family History  Problem Relation Age of Onset  . Diabetes Father      Current Outpatient Medications:  .  ferrous sulfate 325 (65 FE) MG tablet, Take 325 mg by mouth daily with breakfast., Disp: , Rfl:  .  folic acid (FOLVITE) 1 MG tablet, TAKE ONE TABLET BY MOUTH EVERY DAY (Patient not taking: Reported on 09/16/2018), Disp: 30 tablet, Rfl: 0 .  hydrochlorothiazide (MICROZIDE) 12.5 MG capsule, Take 1 capsule by mouth once daily, Disp: 90 capsule, Rfl: 0 .  Iron-Folic [id number] (FERRAPLUS 90) 90-1 MG TABS, TAKE 1 TABLET BY MOUTH ONCE DAILY, Disp: 10 tablet, Rfl: 5 .  levocetirizine (XYZAL) 5 MG tablet, Take 5 mg by mouth every evening., Disp: , Rfl:  .  neomycin-polymyxin-hydrocortisone (CORTISPORIN) otic solution, Place 4 drops into the left ear 4 (four) times daily., Disp: 10 mL, Rfl: 0 .  NIFEdipine (PROCARDIA-XL/NIFEDICAL-XL) 30 MG 24 hr tablet, Take 1 tablet by mouth once daily, Disp: 30 tablet, Rfl: 0 .  Nutritional Supplements (CONCEPTIONXR REPRODUCTIVE) TABS, Take by mouth., Disp: , Rfl:  .  Prenatal Vit-Fe Fumarate-FA (PRENATAL MULTIVITAMIN) TABS, Take 1 tablet by mouth daily., Disp: , Rfl:    No Known Allergies   Review of Systems  Constitutional: Negative.   Respiratory: Negative.   Cardiovascular: Negative.   Gastrointestinal: Negative.    Neurological: Negative.   Psychiatric/Behavioral: Negative.      Today's Vitals   10/31/18 1310  BP: 132/86  Pulse: 80  Temp: 98.4 F (36.9 C)  TempSrc: Oral  Weight: (!) 375 lb (170.1 kg)  Height: 6' (1.829 m)   Body mass index is 50.86 kg/m.   Objective:  Physical Exam Vitals signs and nursing note reviewed.  Constitutional:      Appearance: Normal appearance. She is obese.  HENT:     Head: Normocephalic and atraumatic.  Cardiovascular:     Rate and Rhythm: Normal rate and regular rhythm.     Heart sounds: Normal heart sounds.  Pulmonary:     Effort: Pulmonary effort is normal.     Breath sounds: Normal breath sounds.  Skin:    General: Skin is warm.  Neurological:     General: No focal deficit present.     Mental Status: She is alert.  Psychiatric:        Mood and Affect: Mood normal.        Behavior: Behavior normal.         Assessment And Plan:     1. Essential hypertension, benign  Chronic, fair control. She will continue with both hctz and nifedipine ER daily for now. Pt is aware that optimal bp is less than 130/80. She is encouraged to aim for 150 minutes per week of  regular exercise. She was also advised to take magnesium nightly to help with bp control.   2. Class 3 severe obesity due to excess calories with serious comorbidity and body mass index (BMI) of 50.0 to 59.9 in adult [facility])  Importance of achieving optimal weight to decrease risk of cardiovascular disease and cancers was discussed with the patient in full detail. Importance of regular exercise was discussed with the patient.  She is encouraged to start slowly - start with 10 minutes twice daily at least three to four days per week and to gradually build to 30 minutes five days weekly. She was given tips to incorporate more activity into her daily routine - take stairs when possible, park farther away from her job, grocery stores, etc.    [name], MD    THE PATIENT IS ENCOURAGED TO  PRACTICE SOCIAL DISTANCING DUE TO THE COVID-19 PANDEMIC.

## 2018-12-08 ENCOUNTER — Other Ambulatory Visit: Payer: Self-pay | Admitting: Internal Medicine

## 2019-01-07 ENCOUNTER — Other Ambulatory Visit: Payer: Self-pay | Admitting: Internal Medicine

## 2019-01-14 ENCOUNTER — Encounter: Payer: Self-pay | Admitting: Internal Medicine

## 2019-01-16 ENCOUNTER — Ambulatory Visit (INDEPENDENT_AMBULATORY_CARE_PROVIDER_SITE_OTHER): Payer: BC Managed Care – PPO | Admitting: Internal Medicine

## 2019-01-16 ENCOUNTER — Ambulatory Visit: Payer: BC Managed Care – PPO | Admitting: Internal Medicine

## 2019-01-16 ENCOUNTER — Other Ambulatory Visit: Payer: Self-pay

## 2019-01-16 ENCOUNTER — Encounter: Payer: Self-pay | Admitting: Internal Medicine

## 2019-01-16 VITALS — BP 120/78 | HR 88 | Temp 98.6°F | Ht 71.6 in | Wt 359.8 lb

## 2019-01-16 DIAGNOSIS — I10 Essential (primary) hypertension: Secondary | ICD-10-CM | POA: Diagnosis not present

## 2019-01-16 DIAGNOSIS — R7309 Other abnormal glucose: Secondary | ICD-10-CM

## 2019-01-16 DIAGNOSIS — Z6841 Body Mass Index (BMI) 40.0 and over, adult: Secondary | ICD-10-CM

## 2019-01-16 DIAGNOSIS — D500 Iron deficiency anemia secondary to blood loss (chronic): Secondary | ICD-10-CM | POA: Diagnosis not present

## 2019-01-16 MED ORDER — SAXENDA 18 MG/3ML ~~LOC~~ SOPN: 3.0000 mg | PEN_INJECTOR | Freq: Every day | SUBCUTANEOUS | 2 refills | Status: DC

## 2019-01-16 MED ORDER — FERRAPLUS 90 90-1 MG PO TABS: 1.0000 | ORAL_TABLET | Freq: Every day | ORAL | 2 refills | Status: DC

## 2019-01-16 MED ORDER — NIFEDIPINE ER OSMOTIC RELEASE 30 MG PO TB24: 30.0000 mg | ORAL_TABLET | Freq: Every day | ORAL | 1 refills | Status: DC

## 2019-01-16 NOTE — Patient Instructions (Signed)

## 2019-01-17 LAB — BASIC METABOLIC PANEL WITH GFR
BUN: 8 mg/dL (ref 7–25)
CO2: 28 mmol/L (ref 20–32)
Calcium: 9.8 mg/dL (ref 8.6–10.2)
Chloride: 103 mmol/L (ref 98–110)
Creat: 0.63 mg/dL (ref 0.50–1.10)
GFR, Est African American: 133 mL/min/{1.73_m2} (ref >=60)
GFR, Est Non African American: 115 mL/min/{1.73_m2} (ref >=60)
Glucose, Bld: 99 mg/dL (ref 65–99)
Potassium: 3.9 mmol/L (ref 3.5–5.3)
Sodium: 139 mmol/L (ref 135–146)

## 2019-01-17 LAB — CBC
HCT: 35.2 % (ref 35.0–45.0)
Hemoglobin: 11.1 g/dL — ABNORMAL LOW (ref 11.7–15.5)
MCH: 22.5 pg — ABNORMAL LOW (ref 27.0–33.0)
MCHC: 31.5 g/dL — ABNORMAL LOW (ref 32.0–36.0)
MCV: 71.3 fL — ABNORMAL LOW (ref 80.0–100.0)
MPV: 10 fL (ref 7.5–12.5)
Platelets: 357 10*3/uL (ref 140–400)
RBC: 4.94 10*6/uL (ref 3.80–5.10)
RDW: 16.7 % — ABNORMAL HIGH (ref 11.0–15.0)
WBC: 5.3 10*3/uL (ref 3.8–10.8)

## 2019-01-17 LAB — HEMOGLOBIN A1C
Hgb A1c MFr Bld: 6.3 % of total Hgb — ABNORMAL HIGH (ref <5.7)
Mean Plasma Glucose: 134 (calc)
eAG (mmol/L): 7.4 (calc)

## 2019-01-17 NOTE — Progress Notes (Signed)
This visit occurred during the SARS-CoV-2 public health emergency.  Safety protocols were in place, including screening questions prior to the visit, additional usage of staff PPE, and extensive cleaning of exam room while observing appropriate contact time as indicated for disinfecting solutions.  Subjective:     Patient ID: Adrienne Benjamin , female    DOB: [birth year] , 39 y.o.   MRN: [id number]   Chief Complaint  Patient presents with  . Hypertension    HPI  Hypertension This is a chronic problem. The current episode started more than 1 year ago. The problem has been gradually improving since onset. The problem is controlled. Pertinent negatives include no blurred vision, chest pain or palpitations. Risk factors for coronary artery disease include obesity and sedentary lifestyle. Past treatments include calcium channel blockers and diuretics. The current treatment provides moderate improvement. Compliance problems include exercise.      Past Medical History:  Diagnosis Date  . Abnormal Pap smear 3/11   ascus/ hpv  . Anemia   . Complex ovarian cyst   . Endometriosis 3/07  . Insulin resistance      Family History  Problem Relation Age of Onset  . Diabetes Father      Current Outpatient Medications:  .  hydrochlorothiazide (MICROZIDE) 12.5 MG capsule, Take 1 capsule by mouth once daily, Disp: 90 capsule, Rfl: 0 .  Iron-Folic [id number] (FERRAPLUS 90) 90-1 MG TABS, Take 1 tablet by mouth daily., Disp: 90 tablet, Rfl: 2 .  neomycin-polymyxin-hydrocortisone (CORTISPORIN) otic solution, Place 4 drops into the left ear 4 (four) times daily., Disp: 10 mL, Rfl: 0 .  NIFEdipine (PROCARDIA-XL/NIFEDICAL-XL) 30 MG 24 hr tablet, Take 1 tablet (30 mg total) by mouth daily., Disp: 90 tablet, Rfl: 1 .  Prenatal Vit-Fe Fumarate-FA (PRENATAL MULTIVITAMIN) TABS, Take 1 tablet by mouth daily., Disp: , Rfl:  .  ferrous sulfate 325 (65 FE) MG tablet, Take 325 mg by mouth daily with  breakfast., Disp: , Rfl:  .  folic acid (FOLVITE) 1 MG tablet, TAKE ONE TABLET BY MOUTH EVERY DAY (Patient not taking: Reported on 09/16/2018), Disp: 30 tablet, Rfl: 0 .  levocetirizine (XYZAL) 5 MG tablet, Take 5 mg by mouth every evening., Disp: , Rfl:  .  Liraglutide -Weight Management (SAXENDA) 18 MG/3ML SOPN, Inject 3 mg into the skin daily., Disp: 5 pen, Rfl: 2 .  Nutritional Supplements (CONCEPTIONXR REPRODUCTIVE) TABS, Take by mouth., Disp: , Rfl:    No Known Allergies   Review of Systems  Constitutional: Negative.   Eyes: Negative for blurred vision.  Respiratory: Negative.   Cardiovascular: Negative.  Negative for chest pain and palpitations.  Gastrointestinal: Negative.   Neurological: Negative.   Psychiatric/Behavioral: Negative.      Today's Vitals   01/16/19 1457  BP: 120/78  Pulse: 88  Temp: 98.6 F (37 C)  Weight: (!) 359 lb 12.8 oz (163.2 kg)  Height: 5' 11.6" (1.819 m)   Body mass index is 49.34 kg/m.   Objective:  Physical Exam Vitals and nursing note reviewed.  Constitutional:      Appearance: Normal appearance. She is obese.  HENT:     Head: Normocephalic and atraumatic.  Cardiovascular:     Rate and Rhythm: Normal rate and regular rhythm.     Heart sounds: Normal heart sounds.  Pulmonary:     Effort: Pulmonary effort is normal.     Breath sounds: Normal breath sounds.  Skin:    General: Skin is warm.  Neurological:  General: No focal deficit present.     Mental Status: She is alert.  Psychiatric:        Mood and Affect: Mood normal.        Behavior: Behavior normal.         Assessment And Plan:     1. Essential hypertension, benign  Chronic, well controlled. She will continue with current meds. She is encouraged to avoid adding salt to her foods. She is encouraged to incorporate more exercise into her daily routine. She is advised to aim for 30 minutes five days per week.   - BMP8+EGFR - BMP (Basic Metabolic Panel with GFR) -  Hemoglobin A1c - CBC no Diff  2. Other abnormal glucose  HER A1C HAS BEEN ELEVATED IN THE PAST. I WILL CHECK AN A1C, BMET TODAY. SHE WAS ENCOURAGED TO AVOID SUGARY BEVERAGES AND PROCESSED FOODS INCLUDNG BREADS, RICE AND PASTA.  - BMP (Basic Metabolic Panel with GFR) - Hemoglobin A1c - CBC no Diff  3. Iron deficiency anemia due to chronic blood loss  Chronic. I will check her blood count today.   - CBC no Diff  4. Class 3 severe obesity due to excess calories with serious comorbidity and body mass index (BMI) of 45.0 to 49.9 in adult [facility])  Wt Readings from Last 3 Encounters:  01/16/19 (!) 359 lb 12.8 oz (163.2 kg)  10/31/18 (!) 375 lb (170.1 kg)  09/16/18 (!) 367 lb 12.8 oz (166.8 kg)   She was congratulated on her 16 pound weight loss since October and encouraged to keep up the great work. Again, importance of regular exercise was discussed with the patient. We also discussed the use of Saxenda to help with her weight loss efforts. She denies personal/family history of thyroid cancer. She will start with 0.'6mg'$  daily and increase by 5 clicks once weekly. She was advised of possible side effects including nausea, headache and GI distress. She was advised to stop eating immediately when she feels full. All questions were answered to her satisfaction. She will rto in six weeks for re-evaluation.   [name], MD    THE PATIENT IS ENCOURAGED TO PRACTICE SOCIAL DISTANCING DUE TO THE COVID-19 PANDEMIC.

## 2019-01-19 ENCOUNTER — Encounter: Payer: Self-pay | Admitting: Internal Medicine

## 2019-01-22 ENCOUNTER — Telehealth: Payer: Self-pay

## 2019-01-22 NOTE — Telephone Encounter (Signed)
Attempting to do PA but unable to verify insurance

## 2019-01-23 ENCOUNTER — Other Ambulatory Visit: Payer: Self-pay | Admitting: Internal Medicine

## 2019-01-24 ENCOUNTER — Encounter: Payer: Self-pay | Admitting: Internal Medicine

## 2019-02-04 ENCOUNTER — Encounter: Payer: Self-pay | Admitting: Internal Medicine

## 2019-02-10 ENCOUNTER — Encounter: Payer: Self-pay | Admitting: Internal Medicine

## 2019-02-13 ENCOUNTER — Other Ambulatory Visit: Payer: Self-pay

## 2019-02-13 MED ORDER — LIRAGLUTIDE 18 MG/3ML ~~LOC~~ SOPN: 1.8000 mg | PEN_INJECTOR | Freq: Every day | SUBCUTANEOUS | 1 refills | Status: DC

## 2019-03-18 ENCOUNTER — Ambulatory Visit: Payer: BC Managed Care – PPO | Admitting: Nurse Practitioner

## 2019-03-18 ENCOUNTER — Other Ambulatory Visit: Payer: Self-pay

## 2019-03-18 ENCOUNTER — Encounter: Payer: Self-pay | Admitting: Nurse Practitioner

## 2019-03-18 VITALS — BP 124/80 | HR 80 | Temp 98.6°F | Ht 73.0 in | Wt 346.0 lb

## 2019-03-18 DIAGNOSIS — I10 Essential (primary) hypertension: Secondary | ICD-10-CM

## 2019-03-18 DIAGNOSIS — Z6841 Body Mass Index (BMI) 40.0 and over, adult: Secondary | ICD-10-CM | POA: Diagnosis not present

## 2019-03-18 DIAGNOSIS — R7309 Other abnormal glucose: Secondary | ICD-10-CM

## 2019-03-18 NOTE — Progress Notes (Signed)
This visit occurred during the SARS-CoV-2 public health emergency.  Safety protocols were in place, including screening questions prior to the visit, additional usage of staff PPE, and extensive cleaning of exam room while observing appropriate contact time as indicated for disinfecting solutions.  Subjective:     Patient ID: Adrienne Benjamin , female    DOB: 1980-05-30 , 40 y.o.   MRN: [id number]   Chief Complaint  Patient presents with  . Hypertension    HPI  Wt Readings from Last 3 Encounters: 03/18/19 : (!) 346 lb (156.9 kg) 01/16/19 : (!) 359 lb 12.8 oz (163.2 kg) 10/31/18 : (!) 375 lb (170.1 kg)  She is tolerating victoza well. She is exercising 2 times a week (since January).  She does not have a big appetite.   Hypertension This is a chronic problem. The current episode started more than 1 year ago. The problem is controlled. Pertinent negatives include no anxiety, chest pain, palpitations or shortness of breath. There are no associated agents to hypertension. Risk factors for coronary artery disease include sedentary lifestyle and obesity. There are no compliance problems.  There is no history of angina or kidney disease. There is no history of chronic renal disease.     Past Medical History:  Diagnosis Date  . Abnormal Pap smear 3/11   ascus/ hpv  . Anemia   . Complex ovarian cyst   . Endometriosis 3/07  . Insulin resistance      Family History  Problem Relation Age of Onset  . Diabetes Father      Current Outpatient Medications:  .  ferrous sulfate 325 (65 FE) MG tablet, Take 325 mg by mouth daily with breakfast., Disp: , Rfl:  .  hydrochlorothiazide (MICROZIDE) 12.5 MG capsule, Take 1 capsule by mouth once daily, Disp: 90 capsule, Rfl: 0 .  Iron-Folic [id number] (FERRAPLUS 90) 90-1 MG TABS, Take 1 tablet by mouth daily., Disp: 90 tablet, Rfl: 2 .  liraglutide (VICTOZA) 18 MG/3ML SOPN, Inject 0.3 mLs (1.8 mg total) into the skin daily. Inject 1.8 mg  subcutai, Disp: 3 pen, Rfl: 1 .  NIFEdipine (PROCARDIA-XL/NIFEDICAL-XL) 30 MG 24 hr tablet, Take 1 tablet (30 mg total) by mouth daily., Disp: 90 tablet, Rfl: 1 .  Prenatal Vit-Fe Fumarate-FA (PRENATAL MULTIVITAMIN) TABS, Take 1 tablet by mouth daily., Disp: , Rfl:    No Known Allergies   Review of Systems  Constitutional: Negative.   Respiratory: Negative for cough and shortness of breath.   Cardiovascular: Negative.  Negative for chest pain, palpitations and leg swelling.  Musculoskeletal: Negative.   Psychiatric/Behavioral: Negative.      Today's Vitals   03/18/19 0845  BP: 124/80  Pulse: 80  Temp: 98.6 F (37 C)  TempSrc: Oral  Weight: (!) 346 lb (156.9 kg)  Height: 6\' 1"  (1.854 m)  PainSc: 0-No pain   Body mass index is 45.65 kg/m.   Objective:  Physical Exam Constitutional:      Appearance: Normal appearance.  Cardiovascular:     Rate and Rhythm: Normal rate and regular rhythm.     Pulses: Normal pulses.     Heart sounds: Normal heart sounds. No murmur.  Pulmonary:     Effort: Pulmonary effort is normal. No respiratory distress.     Breath sounds: Normal breath sounds.  Neurological:     General: No focal deficit present.     Mental Status: She is alert and oriented to person, place, and time.  Psychiatric:  Mood and Affect: Mood normal.        Behavior: Behavior normal.        Thought Content: Thought content normal.        Judgment: Judgment normal.         Assessment And Plan:     1. Other abnormal glucose  She is tolerating victoza well  Will check HgbA1c in 2 months.  2. Class 3 severe obesity due to excess calories with serious comorbidity and body mass index (BMI) of 45.0 to 49.9 in adult [facility])  Chronic  Congratulated her on her 13 lb weight loss since January.   Encouraged to continue with exercising regularly  Also advised if she becomes pregnant to inform us to make changes with any medications  3. Essential hypertension,  benign  Chronic, great control  Continue with current medications    Arnette Felts, FNP    THE PATIENT IS ENCOURAGED TO PRACTICE SOCIAL DISTANCING DUE TO THE COVID-19 PANDEMIC.

## 2019-04-22 ENCOUNTER — Other Ambulatory Visit: Payer: Self-pay | Admitting: Internal Medicine

## 2019-05-21 ENCOUNTER — Ambulatory Visit: Payer: BC Managed Care – PPO | Admitting: Internal Medicine

## 2019-05-21 ENCOUNTER — Encounter: Payer: Self-pay | Admitting: Internal Medicine

## 2019-05-21 ENCOUNTER — Other Ambulatory Visit: Payer: Self-pay

## 2019-05-21 VITALS — BP 126/80 | HR 80 | Temp 98.5°F | Ht 73.0 in | Wt 335.8 lb

## 2019-05-21 DIAGNOSIS — I10 Essential (primary) hypertension: Secondary | ICD-10-CM

## 2019-05-21 DIAGNOSIS — Z6841 Body Mass Index (BMI) 40.0 and over, adult: Secondary | ICD-10-CM

## 2019-05-21 DIAGNOSIS — R7309 Other abnormal glucose: Secondary | ICD-10-CM

## 2019-05-21 DIAGNOSIS — N979 Female infertility, unspecified: Secondary | ICD-10-CM | POA: Diagnosis not present

## 2019-05-21 NOTE — Progress Notes (Signed)
This visit occurred during the SARS-CoV-2 public health emergency.  Safety protocols were in place, including screening questions prior to the visit, additional usage of staff PPE, and extensive cleaning of exam room while observing appropriate contact time as indicated for disinfecting solutions.  Subjective:     Patient ID: Adrienne Benjamin , female    DOB: 1980-06-10 , 39 y.o.   MRN: [id number]   Chief Complaint  Patient presents with  . Hypertension  . Obesity    HPI  She is here today for bp check. She reports compliance with meds. States she is now exercising two days per week. She is still taking Victoza, she has not had any issues with the medication.   Hypertension This is a chronic problem. The current episode started more than 1 year ago. The problem has been gradually improving since onset. The problem is controlled. Pertinent negatives include no blurred vision, chest pain, palpitations or shortness of breath. Risk factors for coronary artery disease include obesity. The current treatment provides moderate improvement.     Past Medical History:  Diagnosis Date  . Abnormal Pap smear 3/11   ascus/ hpv  . Anemia   . Complex ovarian cyst   . Endometriosis 3/07  . Insulin resistance      Family History  Problem Relation Age of Onset  . Diabetes Father   . Hypertension Mother      Current Outpatient Medications:  .  Coenzyme Q10 (COQ10) 200 MG CAPS, Take by mouth. 2 gummys per day, Disp: , Rfl:  .  hydrochlorothiazide (MICROZIDE) 12.5 MG capsule, Take 1 capsule by mouth once daily, Disp: 90 capsule, Rfl: 0 .  Iron-Folic [id number] (FERRAPLUS 90) 90-1 MG TABS, Take 1 tablet by mouth daily., Disp: 90 tablet, Rfl: 2 .  NIFEdipine (PROCARDIA-XL/NIFEDICAL-XL) 30 MG 24 hr tablet, Take 1 tablet (30 mg total) by mouth daily., Disp: 90 tablet, Rfl: 1 .  Prenatal Vit-Fe Fumarate-FA (PRENATAL MULTIVITAMIN) TABS, Take 1 tablet by mouth daily., Disp: , Rfl:  .  VICTOZA  18 MG/3ML SOPN, INJECT 1.8MG  INTO THE SKIN DAILY, Disp: 9 mL, Rfl: 0   No Known Allergies   Review of Systems  Constitutional: Negative.   Eyes: Negative for blurred vision.  Respiratory: Negative.  Negative for shortness of breath.   Cardiovascular: Negative.  Negative for chest pain and palpitations.  Gastrointestinal: Negative.   Neurological: Negative.   Psychiatric/Behavioral: Negative.      Today's Vitals   05/21/19 0918  BP: 126/80  Pulse: 80  Temp: 98.5 F (36.9 C)  TempSrc: Oral  Weight: (!) 335 lb 12.8 oz (152.3 kg)  Height: 6\' 1"  (1.854 m)   Body mass index is 44.3 kg/m.   Wt Readings from Last 3 Encounters:  05/21/19 (!) 335 lb 12.8 oz (152.3 kg)  03/18/19 (!) 346 lb (156.9 kg)  01/16/19 (!) 359 lb 12.8 oz (163.2 kg)     Objective:  Physical Exam Vitals and nursing note reviewed.  Constitutional:      Appearance: Normal appearance. She is obese.  HENT:     Head: Normocephalic and atraumatic.  Cardiovascular:     Rate and Rhythm: Normal rate and regular rhythm.     Heart sounds: Normal heart sounds.  Pulmonary:     Effort: Pulmonary effort is normal.     Breath sounds: Normal breath sounds.  Skin:    General: Skin is warm.  Neurological:     General: No focal deficit present.  Mental Status: She is alert.  Psychiatric:        Mood and Affect: Mood normal.        Behavior: Behavior normal.         Assessment And Plan:     1. Essential hypertension, benign  Chronic, well controlled. She will continue with current meds. Previous renal function from Jan 2021 reviewed in full detail during her visit. I will recheck BMP at her next visit.   2. Class 3 severe obesity due to excess calories with serious comorbidity and body mass index (BMI) of 40.0 to 44.9 in adult [facility])  She was congratulated on her 11 pound weight loss in the past 2 months. She will continue with Victoza for prediabetes/obesity. She is encouraged to increase to 4-5 days of  regular exercise.   3. Other abnormal glucose  HER A1C HAS BEEN ELEVATED IN THE PAST. I WILL CHECK AN A1C, BMET TODAY. SHE WAS ENCOURAGED TO AVOID SUGARY BEVERAGES AND PROCESSED FOODS INCLUDNG BREADS, RICE AND PASTA.   4. Infertility, female  She has been trying to get pregnant for the past year. She is encouraged to contact her GYN for further evaluation. Again, importance of clean eating and regular exercise was discussed with the patient.   [name], MD    THE PATIENT IS ENCOURAGED TO PRACTICE SOCIAL DISTANCING DUE TO THE COVID-19 PANDEMIC.

## 2019-05-21 NOTE — Patient Instructions (Signed)

## 2019-05-22 ENCOUNTER — Encounter: Payer: Self-pay | Admitting: Internal Medicine

## 2019-05-28 ENCOUNTER — Encounter: Payer: Self-pay | Admitting: Internal Medicine

## 2019-05-28 ENCOUNTER — Other Ambulatory Visit: Payer: Self-pay | Admitting: Internal Medicine

## 2019-05-28 MED ORDER — VICTOZA 18 MG/3ML ~~LOC~~ SOPN: 1.8000 mg | PEN_INJECTOR | Freq: Every day | SUBCUTANEOUS | 0 refills | Status: DC

## 2019-06-16 ENCOUNTER — Other Ambulatory Visit: Payer: Self-pay | Admitting: Internal Medicine

## 2019-07-07 ENCOUNTER — Other Ambulatory Visit: Payer: Self-pay | Admitting: Internal Medicine

## 2019-07-27 ENCOUNTER — Other Ambulatory Visit: Payer: Self-pay | Admitting: Internal Medicine

## 2019-07-30 ENCOUNTER — Encounter: Payer: Self-pay | Admitting: Internal Medicine

## 2019-07-30 ENCOUNTER — Other Ambulatory Visit: Payer: Self-pay

## 2019-07-30 ENCOUNTER — Ambulatory Visit: Payer: BC Managed Care – PPO | Admitting: Internal Medicine

## 2019-07-30 VITALS — BP 114/86 | HR 81 | Temp 98.8°F | Ht 73.0 in | Wt 333.0 lb

## 2019-07-30 DIAGNOSIS — R7303 Prediabetes: Secondary | ICD-10-CM | POA: Diagnosis not present

## 2019-07-30 DIAGNOSIS — Z6841 Body Mass Index (BMI) 40.0 and over, adult: Secondary | ICD-10-CM | POA: Diagnosis not present

## 2019-07-30 DIAGNOSIS — I10 Essential (primary) hypertension: Secondary | ICD-10-CM

## 2019-07-30 NOTE — Progress Notes (Signed)
I,[name],acting as a Neurosurgeon for [name], MD.,have documented all relevant documentation on the behalf of [name], MD,as directed by  [name], MD while in the presence of [name], MD.  This visit occurred during the SARS-CoV-2 public health emergency.  Safety protocols were in place, including screening questions prior to the visit, additional usage of staff PPE, and extensive cleaning of exam room while observing appropriate contact time as indicated for disinfecting solutions.  Subjective:     Patient ID: Adrienne Benjamin , female    DOB: 11-04-1980 , 39 y.o.   MRN: [id number]   Chief Complaint  Patient presents with  . Hypertension    HPI  The patient is here today for a follow-up on her blood pressure.  Hypertension This is a chronic problem. The current episode started more than 1 year ago. The problem has been gradually improving since onset. The problem is controlled. Pertinent negatives include no blurred vision, chest pain, palpitations or shortness of breath. Risk factors for coronary artery disease include obesity. The current treatment provides moderate improvement.     Past Medical History:  Diagnosis Date  . Abnormal Pap smear 3/11   ascus/ hpv  . Anemia   . Complex ovarian cyst   . Endometriosis 3/07  . Insulin resistance      Family History  Problem Relation Age of Onset  . Diabetes Father   . Hypertension Mother      Current Outpatient Medications:  .  folic acid (FOLVITE) 1 MG tablet, Take 1 mg by mouth daily., Disp: , Rfl:  .  hydrochlorothiazide (MICROZIDE) 12.5 MG capsule, Take 1 capsule by mouth once daily, Disp: 90 capsule, Rfl: 2 .  Iron-Folic Acid-B12-C-Docusate (FERRAPLUS 90) 90-1 MG TABS, Take 1 tablet by mouth daily., Disp: 90 tablet, Rfl: 2 .  NIFEdipine (PROCARDIA-XL/NIFEDICAL-XL) 30 MG 24 hr tablet, Take 1 tablet (30 mg total) by mouth daily., Disp: 90 tablet, Rfl: 1 .  Prenatal Vit-Fe Fumarate-FA  (PRENATAL MULTIVITAMIN) TABS, Take 1 tablet by mouth daily., Disp: , Rfl:  .  VICTOZA 18 MG/3ML SOPN, INJECT 1.8MG  SUBCUTANEOUSLY ONCE DAILY, Disp: 9 mL, Rfl: 0 .  Coenzyme Q10 (COQ10) 200 MG CAPS, Take by mouth. 2 gummys per day (Patient not taking: Reported on 07/30/2019), Disp: , Rfl:    No Known Allergies   Review of Systems  Constitutional: Negative.   Eyes: Negative for blurred vision.  Respiratory: Negative.  Negative for shortness of breath.   Cardiovascular: Negative.  Negative for chest pain and palpitations.  Gastrointestinal: Negative.   Psychiatric/Behavioral: Negative.   All other systems reviewed and are negative.    Today's Vitals   07/30/19 0854  BP: (!) 114/86  Pulse: 81  Temp: 98.8 F (37.1 C)  TempSrc: Oral  Weight: (!) 333 lb (151 kg)  Height: 6\' 1"  (1.854 m)   Body mass index is 43.93 kg/m.  Wt Readings from Last 3 Encounters:  07/30/19 (!) 333 lb (151 kg)  05/21/19 (!) 335 lb 12.8 oz (152.3 kg)  03/18/19 (!) 346 lb (156.9 kg)   Objective:  Physical Exam Vitals and nursing note reviewed.  Constitutional:      Appearance: Normal appearance. She is obese.  HENT:     Head: Normocephalic and atraumatic.  Cardiovascular:     Rate and Rhythm: Normal rate and regular rhythm.     Heart sounds: Normal heart sounds.  Pulmonary:     Effort: Pulmonary effort is normal.  Breath sounds: Normal breath sounds.  Skin:    General: Skin is warm.  Neurological:     General: No focal deficit present.     Mental Status: She is alert and oriented to person, place, and time.  Psychiatric:        Mood and Affect: Mood normal.        Behavior: Behavior normal.         Assessment And Plan:     1. Essential hypertension, benign Comments: Chronic, well controlled. She will continue with current meds.  Encouraged to avoid adding salt to her foods. I will check renal function today.  - BASIC METABOLIC PANEL WITH GFR  2. Prediabetes Comments:  She has been  taking Victoza without any issues.  I plan to switch her to once weekly Ozempic next Monday, 8/2. She will start with 0.25mg  once weekly x 2 weeks and then increasee to 0.5mg  once weekly. She will RTO in six weeks for re-evaluation.   - Insulin, random(561) - Hemoglobin A1c  3. Class 3 severe obesity due to excess calories with serious comorbidity and body mass index (BMI) of 40.0 to 44.9 in adult [facility])   We discussed the use of Wegovy to address obesity. She confirms there is no personal/family h/o thyroid cancer. Possible side effects including nausea, constipation and diarrhea were discussed with the patient. Its association with medullary thyroid carcinoma was also discussed. She is reminded to stop eating when full. We will discuss further at her next visit.    Patient was given opportunity to ask questions. Patient verbalized understanding of the plan and was able to repeat key elements of the plan. All questions were answered to their satisfaction.  [name], MD   I, [name], MD, have reviewed all documentation for this visit. The documentation on 07/30/19 for the exam, diagnosis, procedures, and orders are all accurate and complete.  THE PATIENT IS ENCOURAGED TO PRACTICE SOCIAL DISTANCING DUE TO THE COVID-19 PANDEMIC.

## 2019-07-30 NOTE — Patient Instructions (Addendum)
I plan to switch her to once weekly Ozempic next Monday, 8/2. She will start with 0.25mg  once weekly x 2 weeks and then increase to 0.5mg  once weekly.    Hypertension, Adult Hypertension is another name for high blood pressure. High blood pressure forces your heart to work harder to pump blood. This can cause problems over time. There are two numbers in a blood pressure reading. There is a top number (systolic) over a bottom number (diastolic). It is best to have a blood pressure that is below 120/80. Healthy choices can help lower your blood pressure, or you may need medicine to help lower it. What are the causes? The cause of this condition is not known. Some conditions may be related to high blood pressure. What increases the risk?  Smoking.  Having type 2 diabetes mellitus, high cholesterol, or both.  Not getting enough exercise or physical activity.  Being overweight.  Having too much fat, sugar, calories, or salt (sodium) in your diet.  Drinking too much alcohol.  Having long-term (chronic) kidney disease.  Having a family history of high blood pressure.  Age. Risk increases with age.  Race. You may be at higher risk if you are African American.  Gender. Men are at higher risk than women before age 61. After age 33, women are at higher risk than men.  Having obstructive sleep apnea.  Stress. What are the signs or symptoms?  High blood pressure may not cause symptoms. Very high blood pressure (hypertensive crisis) may cause: ? Headache. ? Feelings of worry or nervousness (anxiety). ? Shortness of breath. ? Nosebleed. ? A feeling of being sick to your stomach (nausea). ? Throwing up (vomiting). ? Changes in how you see. ? Very bad chest pain. ? Seizures. How is this treated?  This condition is treated by making healthy lifestyle changes, such as: ? Eating healthy foods. ? Exercising more. ? Drinking less alcohol.  Your health care provider may prescribe  medicine if lifestyle changes are not enough to get your blood pressure under control, and if: ? Your top number is above 130. ? Your bottom number is above 80.  Your personal target blood pressure may vary. Follow these instructions at home: Eating and drinking   If told, follow the DASH eating plan. To follow this plan: ? Fill one half of your plate at each meal with fruits and vegetables. ? Fill one fourth of your plate at each meal with whole grains. Whole grains include whole-wheat pasta, brown rice, and whole-grain bread. ? Eat or drink low-fat dairy products, such as skim milk or low-fat yogurt. ? Fill one fourth of your plate at each meal with low-fat (lean) proteins. Low-fat proteins include fish, chicken without skin, eggs, beans, and tofu. ? Avoid fatty meat, cured and processed meat, or chicken with skin. ? Avoid pre-made or processed food.  Eat less than 1,500 mg of salt each day.  Do not drink alcohol if: ? Your doctor tells you not to drink. ? You are pregnant, may be pregnant, or are planning to become pregnant.  If you drink alcohol: ? Limit how much you use to:  0-1 drink a day for women.  0-2 drinks a day for men. ? Be aware of how much alcohol is in your drink. In the [place] one drink equals one 12 oz bottle of beer (355 mL), one 5 oz glass of wine (148 mL), or one 1 oz glass of hard liquor (44 mL). Lifestyle   Work  with your doctor to stay at a healthy weight or to lose weight. Ask your doctor what the best weight is for you.  Get at least 30 minutes of exercise most days of the week. This may include walking, swimming, or biking.  Get at least 30 minutes of exercise that strengthens your muscles (resistance exercise) at least 3 days a week. This may include lifting weights or doing Pilates.  Do not use any products that contain nicotine or tobacco, such as cigarettes, e-cigarettes, and chewing tobacco. If you need help quitting, ask your doctor.  Check  your blood pressure at home as told by your doctor.  Keep all follow-up visits as told by your doctor. This is important. Medicines  Take over-the-counter and prescription medicines only as told by your doctor. Follow directions carefully.  Do not skip doses of blood pressure medicine. The medicine does not work as well if you skip doses. Skipping doses also puts you at risk for problems.  Ask your doctor about side effects or reactions to medicines that you should watch for. Contact a doctor if you:  Think you are having a reaction to the medicine you are taking.  Have headaches that keep coming back (recurring).  Feel dizzy.  Have swelling in your ankles.  Have trouble with your vision. Get help right away if you:  Get a very bad headache.  Start to feel mixed up (confused).  Feel weak or numb.  Feel faint.  Have very bad pain in your: ? Chest. ? Belly (abdomen).  Throw up more than once.  Have trouble breathing. Summary  Hypertension is another name for high blood pressure.  High blood pressure forces your heart to work harder to pump blood.  For most people, a normal blood pressure is less than 120/80.  Making healthy choices can help lower blood pressure. If your blood pressure does not get lower with healthy choices, you may need to take medicine. This information is not intended to replace advice given to you by your health care provider. Make sure you discuss any questions you have with your health care provider. Document Revised: 08/29/2017 Document Reviewed: 08/29/2017 Elsevier Patient Education  2020 [employer].

## 2019-07-31 LAB — BASIC METABOLIC PANEL WITH GFR
BUN: 8 mg/dL (ref 7–25)
CO2: 28 mmol/L (ref 20–32)
Calcium: 9.2 mg/dL (ref 8.6–10.2)
Chloride: 103 mmol/L (ref 98–110)
Creat: 0.67 mg/dL (ref 0.50–1.10)
GFR, Est African American: 129 mL/min/{1.73_m2} (ref >=60)
GFR, Est Non African American: 112 mL/min/{1.73_m2} (ref >=60)
Glucose, Bld: 89 mg/dL (ref 65–99)
Potassium: 4.3 mmol/L (ref 3.5–5.3)
Sodium: 138 mmol/L (ref 135–146)

## 2019-07-31 LAB — HEMOGLOBIN A1C
Hgb A1c MFr Bld: 5.5 % of total Hgb (ref <5.7)
Mean Plasma Glucose: 111 (calc)
eAG (mmol/L): 6.2 (calc)

## 2019-07-31 LAB — INSULIN, RANDOM: Insulin: 15.6 u[IU]/mL

## 2019-08-07 ENCOUNTER — Encounter: Payer: Self-pay | Admitting: Internal Medicine

## 2019-08-08 ENCOUNTER — Other Ambulatory Visit: Payer: Self-pay | Admitting: Obstetrics and Gynecology

## 2019-08-08 DIAGNOSIS — N979 Female infertility, unspecified: Secondary | ICD-10-CM

## 2019-08-14 ENCOUNTER — Ambulatory Visit
Admission: RE | Admit: 2019-08-14 | Discharge: 2019-08-14 | Disposition: A | Discharge status: Home / Self Care | Payer: BC Managed Care – PPO | Source: Ambulatory Visit | Attending: Obstetrics and Gynecology | Admitting: Obstetrics and Gynecology

## 2019-08-14 DIAGNOSIS — N979 Female infertility, unspecified: Secondary | ICD-10-CM

## 2019-09-07 ENCOUNTER — Other Ambulatory Visit: Payer: Self-pay | Admitting: Internal Medicine

## 2019-09-07 DIAGNOSIS — I10 Essential (primary) hypertension: Secondary | ICD-10-CM

## 2019-09-11 ENCOUNTER — Encounter: Payer: Self-pay | Admitting: Internal Medicine

## 2019-09-15 ENCOUNTER — Encounter: Payer: Self-pay | Admitting: Internal Medicine

## 2019-09-16 ENCOUNTER — Encounter: Payer: Self-pay | Admitting: Internal Medicine

## 2019-09-16 ENCOUNTER — Telehealth (INDEPENDENT_AMBULATORY_CARE_PROVIDER_SITE_OTHER): Payer: BC Managed Care – PPO | Admitting: Internal Medicine

## 2019-09-16 ENCOUNTER — Telehealth: Payer: Self-pay

## 2019-09-16 VITALS — Temp 98.6°F

## 2019-09-16 DIAGNOSIS — J208 Acute bronchitis due to other specified organisms: Secondary | ICD-10-CM | POA: Diagnosis not present

## 2019-09-16 DIAGNOSIS — R7303 Prediabetes: Secondary | ICD-10-CM | POA: Diagnosis not present

## 2019-09-16 DIAGNOSIS — U071 COVID-19: Secondary | ICD-10-CM

## 2019-09-16 MED ORDER — AZITHROMYCIN 250 MG PO TABS: ORAL_TABLET | ORAL | 0 refills | Status: AC

## 2019-09-16 NOTE — Patient Instructions (Signed)
COVID-19: Quarantine vs. Isolation QUARANTINE keeps someone who was in close contact with someone who has COVID-19 away from others. If you had close contact with a person who has COVID-19  Stay home until 14 days after your last contact.  Check your temperature twice a day and watch for symptoms of COVID-19.  If possible, stay away from people who are at higher-risk for getting very sick from COVID-19. ISOLATION keeps someone who is sick or tested positive for COVID-19 without symptoms away from others, even in their own home. If you are sick and think or know you have COVID-19  Stay home until after ? At least 10 days since symptoms first appeared and ? At least 24 hours with no fever without fever-reducing medication and ? Symptoms have improved If you tested positive for COVID-19 but do not have symptoms  Stay home until after ? 10 days have passed since your positive test If you live with others, stay in a specific "sick room" or area and away from other people or animals, including pets. Use a separate bathroom, if available. cdc.gov/coronavirus 07/22/2018 This information is not intended to replace advice given to you by your health care provider. Make sure you discuss any questions you have with your health care provider. Document Revised: 12/05/2018 Document Reviewed: 12/05/2018 Elsevier Patient Education  2020 Elsevier Inc.  

## 2019-09-16 NOTE — Progress Notes (Signed)
Virtual Visit via Video   This visit type was conducted due to national recommendations for restrictions regarding the COVID-19 Pandemic (e.g. social distancing) in an effort to limit this patient's exposure and mitigate transmission in our community.  Due to her co-morbid illnesses, this patient is at least at moderate risk for complications without adequate follow up.  This format is felt to be most appropriate for this patient at this time.  All issues noted in this document were discussed and addressed.  A limited physical exam was performed with this format.    This visit type was conducted due to national recommendations for restrictions regarding the COVID-19 Pandemic (e.g. social distancing) in an effort to limit this patient's exposure and mitigate transmission in our community.  Patients identity confirmed using two different identifiers.  This format is felt to be most appropriate for this patient at this time.  All issues noted in this document were discussed and addressed.  No physical exam was performed (except for noted visual exam findings with Video Visits).    Date:  09/20/2019   ID:  Adrienne Benjamin, DOB 03-Jan-1980, MRN [id number]  Patient Location:  Home  Provider location:   Office   Chief Complaint:  "I have COVID"  History of Present Illness:    Adrienne Benjamin is a 39 y.o. female who presents via video conferencing for a telehealth visit today.  She presents because of recent COVID diagnosis. She reports she developed symptoms last Thursday which included headache and ear pain.  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 She went home tested her temp and was found to have fever 100.2.  The next day she did not have a fever, so she thought her sx were due to allergies.  She started taking Theraflu and Mucinex without any relief. She was then advised to get tested by her boss.  She was tested at [facility] yesterday, 9/13 and tested  positive with rapid test.   Today, she c/o congestion, fatigue and "cold sx". She is not yet received her COVID vaccine. She is trying to conceive. Will discuss further with her GYN.   The patient does have symptoms concerning for COVID-19 infection (fever, chills, cough, or new shortness of breath).     Past Medical History:  Diagnosis Date  . Abnormal Pap smear 3/11   ascus/ hpv  . Anemia   . Complex ovarian cyst   . Endometriosis 3/07  . Insulin resistance    Past Surgical History:  Procedure Laterality Date  . impacted wisdom teeth  2006  . OVARIAN CYST REMOVAL  2005     Current Meds  Medication Sig  . Cholecalciferol (VITAMIN D) 50 MCG (2000 UT) CAPS Take by mouth. daily  . folic acid (FOLVITE) 1 MG tablet Take 1 mg by mouth daily.  . hydrochlorothiazide (MICROZIDE) 12.5 MG capsule Take 1 capsule by mouth once daily  . Iron-Folic Acid-B12-C-Docusate (FERRAPLUS 90) 90-1 MG TABS Take 1 tablet by mouth daily.  Marland Kitchen NIFEdipine (PROCARDIA-XL/NIFEDICAL-XL) 30 MG 24 hr tablet Take 1 tablet by mouth once daily  . Omega-3 1000 MG CAPS Take by mouth. 2 per day  . Prenatal Vit-Fe Fumarate-FA (PRENATAL MULTIVITAMIN) TABS Take 1 tablet by mouth daily.  Marland Kitchen VICTOZA 18 MG/3ML SOPN INJECT 1.8MG  SUBCUTANEOUSLY ONCE DAILY     Allergies:   Patient has no known allergies.   Social History   Tobacco Use  . Smoking status: Never Smoker  .  Smokeless tobacco: Never Used  Substance Use Topics  . Alcohol use: No  . Drug use: No     Family Hx: The patient's family history includes Diabetes in her father; Hypertension in her mother.  ROS:   Please see the history of present illness.    Review of Systems  Constitutional: Positive for fever and malaise/fatigue.  HENT: Positive for congestion.        Loss of taste  Respiratory: Positive for cough.   Cardiovascular: Negative.   Gastrointestinal: Negative.   Genitourinary: Negative.   Neurological: Negative.   Psychiatric/Behavioral:  Negative.     All other systems reviewed and are negative.   Labs/Other Tests and Data Reviewed:    Recent Labs: 01/16/2019: Hemoglobin 11.1; Platelets 357 07/30/2019: BUN 8; Creat 0.67; Potassium 4.3; Sodium 138   Recent Lipid Panel Lab Results  Component Value Date/Time   CHOL 154 09/16/2018 11:23 AM   TRIG 58 09/16/2018 11:23 AM   HDL 60 09/16/2018 11:23 AM   CHOLHDL 2.6 09/16/2018 11:23 AM   LDLCALC 81 09/16/2018 11:23 AM    Wt Readings from Last 3 Encounters:  07/30/19 (!) 333 lb (151 kg)  05/21/19 (!) 335 lb 12.8 oz (152.3 kg)  03/18/19 (!) 346 lb (156.9 kg)     Exam:    Vital Signs:  Temp 98.6 F (37 C) (Oral)   LMP 09/03/2019     Physical Exam Vitals and nursing note reviewed.  Constitutional:      Appearance: She is obese.  HENT:     Head: Normocephalic and atraumatic.  Pulmonary:     Effort: Pulmonary effort is normal.     Comments: Able to speak in full sentences without shortness of breath.  Musculoskeletal:     Cervical back: Normal range of motion.  Neurological:     Mental Status: She is alert and oriented to person, place, and time.  Psychiatric:        Mood and Affect: Affect normal.     ASSESSMENT & PLAN:    1. Acute bronchitis due to COVID-19 virus Comments: She agrees to check temp daily. I will send in Zpack. Encouraged to follow isolation guidelines and to go to ER should her sx worsen.  - MyChart COVID-19 home monitoring program; Future - Temperature monitoring; Future - azithromycin (ZITHROMAX Z-PAK) 250 MG tablet; Take 2 tablets (500 mg) on  Day 1,  followed by 1 tablet (250 mg) once daily on Days 2 through 5.  Dispense: 6 each; Refill: 0  2. Prediabetes Comments: She will have a family member pick up sample of Ozempic later this week.   - Omega-3 1000 MG CAPS; Take by mouth. 2 per day - Cholecalciferol (VITAMIN D) 50 MCG (2000 UT) CAPS; Take by mouth. daily   COVID-19 Education: The signs and symptoms of COVID-19 were  discussed with the patient and how to seek care for testing (follow up with PCP or arrange E-visit).  The importance of social distancing was discussed today.  Patient Risk:   After full review of this patients clinical status, I feel that they are at least moderate risk at this time.  Time:   Today, I have spent 15 minutes/ seconds with the patient with telehealth technology discussing above diagnoses.  I personally spent 20 minutes face-to-face and non-face-to-face in the care of this patient, which includes all pre-, intra-, and post visit time on the date of service.    Medication Adjustments/Labs and Tests Ordered: Current medicines are reviewed  at length with the patient today.  Concerns regarding medicines are outlined above.   Tests Ordered: No orders of the defined types were placed in this encounter.   Medication Changes: Meds ordered this encounter  Medications  . azithromycin (ZITHROMAX Z-PAK) 250 MG tablet    Sig: Take 2 tablets (500 mg) on  Day 1,  followed by 1 tablet (250 mg) once daily on Days 2 through 5.    Dispense:  6 each    Refill:  0    Disposition:  Follow up prn  Signed, [name], MD

## 2019-09-16 NOTE — Telephone Encounter (Signed)
The pt consented to a virtual appt. 

## 2019-09-29 ENCOUNTER — Encounter: Payer: BC Managed Care – PPO | Admitting: Internal Medicine

## 2019-10-09 ENCOUNTER — Encounter: Payer: Self-pay | Admitting: Internal Medicine

## 2019-10-15 ENCOUNTER — Other Ambulatory Visit: Payer: Self-pay

## 2019-10-15 ENCOUNTER — Ambulatory Visit: Payer: BC Managed Care – PPO | Admitting: Internal Medicine

## 2019-10-15 ENCOUNTER — Encounter: Payer: Self-pay | Admitting: Internal Medicine

## 2019-10-15 VITALS — BP 132/70 | HR 84 | Temp 98.1°F | Ht 71.4 in | Wt 328.0 lb

## 2019-10-15 DIAGNOSIS — Z0000 Encounter for general adult medical examination without abnormal findings: Secondary | ICD-10-CM

## 2019-10-15 DIAGNOSIS — Z1159 Encounter for screening for other viral diseases: Secondary | ICD-10-CM

## 2019-10-15 DIAGNOSIS — Z23 Encounter for immunization: Secondary | ICD-10-CM

## 2019-10-15 DIAGNOSIS — R7303 Prediabetes: Secondary | ICD-10-CM | POA: Diagnosis not present

## 2019-10-15 DIAGNOSIS — E559 Vitamin D deficiency, unspecified: Secondary | ICD-10-CM | POA: Diagnosis not present

## 2019-10-15 DIAGNOSIS — I10 Essential (primary) hypertension: Secondary | ICD-10-CM | POA: Diagnosis not present

## 2019-10-15 DIAGNOSIS — Z6841 Body Mass Index (BMI) 40.0 and over, adult: Secondary | ICD-10-CM

## 2019-10-15 LAB — POCT URINALYSIS DIPSTICK
Bilirubin, UA: NEGATIVE
Blood, UA: NEGATIVE
Glucose, UA: NEGATIVE
Ketones, UA: NEGATIVE
Nitrite, UA: NEGATIVE
Protein, UA: NEGATIVE
Spec Grav, UA: 1.02 (ref 1.010–1.025)
Urobilinogen, UA: 0.2 E.U./dL
pH, UA: 8 (ref 5.0–8.0)

## 2019-10-15 LAB — POCT UA - MICROALBUMIN
Creatinine, POC: 50 mg/dL
Microalbumin Ur, POC: 10 mg/L

## 2019-10-15 NOTE — Progress Notes (Signed)
I,Adrienne Benjamin,acting as a Education administrator for [name], MD.,have documented all relevant documentation on the behalf of [name], MD,as directed by  [name], MD while in the presence of [name], MD.  This visit occurred during the SARS-CoV-2 public health emergency.  Safety protocols were in place, including screening questions prior to the visit, additional usage of staff PPE, and extensive cleaning of exam room while observing appropriate contact time as indicated for disinfecting solutions.  Subjective:     Patient ID: Adrienne Benjamin , female    DOB: 1980-04-05 , 39 y.o.   MRN: [id number]   Chief Complaint  Patient presents with  . Annual Exam  . Hypertension    HPI  She is here today for a full physical examination. She has her pap smears performed by Dr. Charlesetta Garibaldi. She is scheduled to see her on 10/29/2019 She is currently trying to conceive. She is taking prenatal vitamins.   Hypertension This is a chronic problem. The current episode started more than 1 year ago. The problem has been gradually improving since onset. The problem is controlled. Pertinent negatives include no blurred vision, chest pain or palpitations. Risk factors for coronary artery disease include obesity and sedentary lifestyle. Past treatments include calcium channel blockers and diuretics. The current treatment provides moderate improvement. Compliance problems include exercise.      Past Medical History:  Diagnosis Date  . Abnormal Pap smear 3/11   [place]/ hpv  . Anemia   . Complex ovarian cyst   . Endometriosis 3/07  . Insulin resistance      Family History  Problem Relation Age of Onset  . Diabetes Father   . Hypertension Mother      Current Outpatient Medications:  .  Cholecalciferol (VITAMIN D) 50 MCG (2000 UT) CAPS, Take by mouth. daily, Disp: , Rfl:  .  hydrochlorothiazide (MICROZIDE) 12.5 MG capsule, Take 1 capsule by mouth once daily, Disp: 90 capsule, Rfl: 2 .   Iron-Folic [id number] (FERRAPLUS 90) 90-1 MG TABS, Take 1 tablet by mouth daily., Disp: 90 tablet, Rfl: 2 .  NIFEdipine (PROCARDIA-XL/NIFEDICAL-XL) 30 MG 24 hr tablet, Take 1 tablet by mouth once daily, Disp: 90 tablet, Rfl: 0 .  Omega-3 1000 MG CAPS, Take by mouth. 2 per day, Disp: , Rfl:  .  Prenatal Vit-Fe Fumarate-FA (PRENATAL MULTIVITAMIN) TABS, Take 1 tablet by mouth daily., Disp: , Rfl:  .  Semaglutide,0.25 or 0.5MG /DOS, (OZEMPIC, 0.25 OR 0.5 MG/DOSE,) 2 MG/1.5ML SOPN, Ozempic 0.25 mg or 0.5 mg (2 mg/1.5 mL) subcutaneous pen injector  Inject by subcutaneous route., Disp: , Rfl:  .  metFORMIN (GLUCOPHAGE) 500 MG tablet, Take 1 tablet (500 mg total) by mouth 3 (three) times daily with meals., Disp: 90 tablet, Rfl: 0   Allergies  Allergen Reactions  . Ciprofloxacin Nausea And Vomiting      The patient states she uses none for birth control. Last LMP was Patient's last menstrual period was 10/03/2019.. Negative for Dysmenorrhea. Negative for: breast discharge, breast lump(s), breast pain and breast self exam. Associated symptoms include abnormal vaginal bleeding. Pertinent negatives include abnormal bleeding (hematology), anxiety, decreased libido, depression, difficulty falling sleep, dyspareunia, history of infertility, nocturia, sexual dysfunction, sleep disturbances, urinary incontinence, urinary urgency, vaginal discharge and vaginal itching. Diet regular.The patient states her exercise level is  intermittent.   . The patient's tobacco use is:  Social History   Tobacco Use  Smoking Status Never Smoker  Smokeless Tobacco Never Used  . She  has been exposed to passive smoke. The patient's alcohol use is:  Social History   Substance and Sexual Activity  Alcohol Use No     Review of Systems  Constitutional: Negative.   HENT: Negative.   Eyes: Negative.  Negative for blurred vision.  Respiratory: Negative.   Cardiovascular: Negative.  Negative for chest pain and  palpitations.  Gastrointestinal: Negative.   Endocrine: Negative.   Genitourinary: Negative.   Musculoskeletal: Negative.   Skin: Negative.   Allergic/Immunologic: Negative.   Neurological: Negative.   Hematological: Negative.   Psychiatric/Behavioral: Negative.      Today's Vitals   10/15/19 1009  BP: 132/70  Pulse: 84  Temp: 98.1 F (36.7 C)  TempSrc: Oral  Weight: (!) 328 lb (148.8 kg)  Height: 5' 11.4" (1.814 m)   Body mass index is 45.24 kg/m.   Wt Readings from Last 3 Encounters:  10/15/19 (!) 328 lb (148.8 kg)  07/30/19 (!) 333 lb (151 kg)  05/21/19 (!) 335 lb 12.8 oz (152.3 kg)    Objective:  Physical Exam Vitals and nursing note reviewed.  Constitutional:      Appearance: Normal appearance. She is obese.  HENT:     Head: Normocephalic and atraumatic.     Right Ear: Tympanic membrane, ear canal and external ear normal.     Left Ear: Tympanic membrane, ear canal and external ear normal.     Nose:     Comments: Deferred, masked    Mouth/Throat:     Comments: Deferred, masked Eyes:     Extraocular Movements: Extraocular movements intact.     Conjunctiva/sclera: Conjunctivae normal.     Pupils: Pupils are equal, round, and reactive to light.  Cardiovascular:     Rate and Rhythm: Normal rate and regular rhythm.     Pulses: Normal pulses.     Heart sounds: Normal heart sounds.  Pulmonary:     Effort: Pulmonary effort is normal.     Breath sounds: Normal breath sounds.  Chest:     Breasts: Tanner Score is 5.        Right: Normal.        Left: Normal.  Abdominal:     General: Bowel sounds are normal.     Palpations: Abdomen is soft.     Comments: Obese, soft  Genitourinary:    Comments: deferred Musculoskeletal:        General: Normal range of motion.     Cervical back: Normal range of motion and neck supple.  Skin:    General: Skin is warm and dry.  Neurological:     General: No focal deficit present.     Mental Status: She is alert and  oriented to person, place, and time.  Psychiatric:        Mood and Affect: Mood normal.        Behavior: Behavior normal.         Assessment And Plan:     1. Routine general medical examination at health care facility Comments: A full exam was performed. Importance of monthly self breast exams was discussed with the patient. PATIENT IS ADVISED TO GET 30-45 MINUTES REGULAR EXERCISE NO LESS THAN FOUR TO FIVE DAYS PER WEEK - BOTH WEIGHTBEARING EXERCISES AND AEROBIC ARE RECOMMENDED.  PATIENT IS ADVISED TO FOLLOW A HEALTHY DIET WITH AT LEAST SIX FRUITS/VEGGIES PER DAY, DECREASE INTAKE OF RED MEAT, AND TO INCREASE FISH INTAKE TO TWO DAYS PER WEEK.  MEATS/FISH SHOULD NOT BE FRIED, BAKED OR BROILED IS  PREFERABLE.  I SUGGEST WEARING SPF 50 SUNSCREEN ON EXPOSED PARTS AND ESPECIALLY WHEN IN THE DIRECT SUNLIGHT FOR AN EXTENDED PERIOD OF TIME.  PLEASE AVOID FAST FOOD RESTAURANTS AND INCREASE YOUR WATER INTAKE.  - CBC - CMP14+EGFR - Lipid panel - Hepatitis C antibody - Flu Vaccine QUAD 6+ mos PF IM (Fluarix Quad PF) - Iron, TIBC and Ferritin Panel  2. Essential hypertension, benign Comments: Chronic, fair control. She will continue with current meds for now. Encouraged to avoid adding salt to her foods and to incorporate more exercise into her daily. EKG performed, NSR w/ prominent R(v1). She will rto in six months for re-evaluation.  - POCT Urinalysis Dipstick (81002) - POCT UA - Microalbumin - EKG 12-Lead - COMPLETE METABOLIC PANEL WITH GFR  3. Prediabetes  Chronic. I will check hba1c today. Encouraged to avoid sugary beverages and incorporate more exercise into her daily routine.   - Semaglutide,0.25 or 0.5MG /DOS, (OZEMPIC, 0.25 OR 0.5 MG/DOSE,) 2 MG/1.5ML SOPN; Ozempic 0.25 mg or 0.5 mg (2 mg/1.5 mL) subcutaneous pen injector  Inject by subcutaneous route.  4. Vitamin D deficiency Comments: I will check a vitamin D level and supplement as needed.  - VITAMIN D 25 Hydroxy (Vit-D Deficiency,  Fractures)  5. Class 3 severe obesity due to excess calories with serious comorbidity and body mass index (BMI) of 45.0 to 49.9 in adult [facility]) Comments: BMI 45. Encouraged to strive for BMI less than 35 to decrease cardiac risk. Advised to aim for at least 150 minutes of exercise per week.    6. Immunization due  She was given flu shot to update her immunization history.     Patient was given opportunity to ask questions. Patient verbalized understanding of the plan and was able to repeat key elements of the plan. All questions were answered to their satisfaction.   [name], MD   I, [name], MD, have reviewed all documentation for this visit. The documentation on 10/25/19 for the exam, diagnosis, procedures, and orders are all accurate and complete.  THE PATIENT IS ENCOURAGED TO PRACTICE SOCIAL DISTANCING DUE TO THE COVID-19 PANDEMIC.

## 2019-10-15 NOTE — Patient Instructions (Signed)
Health Maintenance, Female Adopting a healthy lifestyle and getting preventive care are important in promoting health and wellness. Ask your health care provider about:  The right schedule for you to have regular tests and exams.  Things you can do on your own to prevent diseases and keep yourself healthy. What should I know about diet, weight, and exercise? Eat a healthy diet   Eat a diet that includes plenty of vegetables, fruits, low-fat dairy products, and lean protein.  Do not eat a lot of foods that are high in solid fats, added sugars, or sodium. Maintain a healthy weight Body mass index (BMI) is used to identify weight problems. It estimates body fat based on height and weight. Your health care provider can help determine your BMI and help you achieve or maintain a healthy weight. Get regular exercise Get regular exercise. This is one of the most important things you can do for your health. Most adults should:  Exercise for at least 150 minutes each week. The exercise should increase your heart rate and make you sweat (moderate-intensity exercise).  Do strengthening exercises at least twice a week. This is in addition to the moderate-intensity exercise.  Spend less time sitting. Even light physical activity can be beneficial. Watch cholesterol and blood lipids Have your blood tested for lipids and cholesterol at 38 years of age, then have this test every 5 years. Have your cholesterol levels checked more often if:  Your lipid or cholesterol levels are high.  You are older than 38 years of age.  You are at high risk for heart disease. What should I know about cancer screening? Depending on your health history and family history, you may need to have cancer screening at various ages. This may include screening for:  Breast cancer.  Cervical cancer.  Colorectal cancer.  Skin cancer.  Lung cancer. What should I know about heart disease, diabetes, and high blood  pressure? Blood pressure and heart disease  High blood pressure causes heart disease and increases the risk of stroke. This is more likely to develop in people who have high blood pressure readings, are of African descent, or are overweight.  Have your blood pressure checked: ? Every 3-5 years if you are 18-39 years of age. ? Every year if you are 40 years old or older. Diabetes Have regular diabetes screenings. This checks your fasting blood sugar level. Have the screening done:  Once every three years after age 40 if you are at a normal weight and have a low risk for diabetes.  More often and at a younger age if you are overweight or have a high risk for diabetes. What should I know about preventing infection? Hepatitis B If you have a higher risk for hepatitis B, you should be screened for this virus. Talk with your health care provider to find out if you are at risk for hepatitis B infection. Hepatitis C Testing is recommended for:  Everyone born from 1945 through 1965.  Anyone with known risk factors for hepatitis C. Sexually transmitted infections (STIs)  Get screened for STIs, including gonorrhea and chlamydia, if: ? You are sexually active and are younger than 38 years of age. ? You are older than 38 years of age and your health care provider tells you that you are at risk for this type of infection. ? Your sexual activity has changed since you were last screened, and you are at increased risk for chlamydia or gonorrhea. Ask your health care provider if   you are at risk.  Ask your health care provider about whether you are at high risk for HIV. Your health care provider may recommend a prescription medicine to help prevent HIV infection. If you choose to take medicine to prevent HIV, you should first get tested for HIV. You should then be tested every 3 months for as long as you are taking the medicine. Pregnancy  If you are about to stop having your period (premenopausal) and  you may become pregnant, seek counseling before you get pregnant.  Take 400 to 800 micrograms (mcg) of folic acid every day if you become pregnant.  Ask for birth control (contraception) if you want to prevent pregnancy. Osteoporosis and menopause Osteoporosis is a disease in which the bones lose minerals and strength with aging. This can result in bone fractures. If you are 65 years old or older, or if you are at risk for osteoporosis and fractures, ask your health care provider if you should:  Be screened for bone loss.  Take a calcium or vitamin D supplement to lower your risk of fractures.  Be given hormone replacement therapy (HRT) to treat symptoms of menopause. Follow these instructions at home: Lifestyle  Do not use any products that contain nicotine or tobacco, such as cigarettes, e-cigarettes, and chewing tobacco. If you need help quitting, ask your health care provider.  Do not use street drugs.  Do not share needles.  Ask your health care provider for help if you need support or information about quitting drugs. Alcohol use  Do not drink alcohol if: ? Your health care provider tells you not to drink. ? You are pregnant, may be pregnant, or are planning to become pregnant.  If you drink alcohol: ? Limit how much you use to 0-1 drink a day. ? Limit intake if you are breastfeeding.  Be aware of how much alcohol is in your drink. In the [place] one drink equals one 12 oz bottle of beer (355 mL), one 5 oz glass of wine (148 mL), or one 1 oz glass of hard liquor (44 mL). General instructions  Schedule regular health, dental, and eye exams.  Stay current with your vaccines.  Tell your health care provider if: ? You often feel depressed. ? You have ever been abused or do not feel safe at home. Summary  Adopting a healthy lifestyle and getting preventive care are important in promoting health and wellness.  Follow your health care provider's instructions about healthy  diet, exercising, and getting tested or screened for diseases.  Follow your health care provider's instructions on monitoring your cholesterol and blood pressure. This information is not intended to replace advice given to you by your health care provider. Make sure you discuss any questions you have with your health care provider. Document Revised: 12/12/2017 Document Reviewed: 12/12/2017 Elsevier Patient Education  2020 [employer]  

## 2019-10-16 ENCOUNTER — Encounter: Payer: Self-pay | Admitting: Internal Medicine

## 2019-10-16 LAB — CBC
HCT: 35 % (ref 35.0–45.0)
Hemoglobin: 10.9 g/dL — ABNORMAL LOW (ref 11.7–15.5)
MCH: 22.7 pg — ABNORMAL LOW (ref 27.0–33.0)
MCHC: 31.1 g/dL — ABNORMAL LOW (ref 32.0–36.0)
MCV: 72.8 fL — ABNORMAL LOW (ref 80.0–100.0)
MPV: 10.6 fL (ref 7.5–12.5)
Platelets: 339 10*3/uL (ref 140–400)
RBC: 4.81 10*6/uL (ref 3.80–5.10)
RDW: 17.7 % — ABNORMAL HIGH (ref 11.0–15.0)
WBC: 4.2 10*3/uL (ref 3.8–10.8)

## 2019-10-16 LAB — LIPID PANEL
Cholesterol: 175 mg/dL (ref <200)
HDL: 60 mg/dL (ref >=50)
LDL Cholesterol (Calc): 98 mg/dL (calc)
Non-HDL Cholesterol (Calc): 115 mg/dL (calc) (ref <130)
Total CHOL/HDL Ratio: 2.9 (calc) (ref <5.0)
Triglycerides: 77 mg/dL (ref <150)

## 2019-10-16 LAB — VITAMIN D 25 HYDROXY (VIT D DEFICIENCY, FRACTURES): Vit D, 25-Hydroxy: 39 ng/mL (ref 30–100)

## 2019-10-17 ENCOUNTER — Other Ambulatory Visit: Payer: Self-pay | Admitting: Internal Medicine

## 2019-10-17 MED ORDER — METFORMIN HCL 500 MG PO TABS: 500.0000 mg | ORAL_TABLET | Freq: Three times a day (TID) | ORAL | 0 refills | Status: DC

## 2019-10-20 LAB — COMPLETE METABOLIC PANEL WITH GFR
AG Ratio: 1.2 (calc) (ref 1.0–2.5)
ALT: 9 U/L (ref 6–29)
AST: 16 U/L (ref 10–30)
Albumin: 4.1 g/dL (ref 3.6–5.1)
Alkaline phosphatase (APISO): 97 U/L (ref 31–125)
BUN/Creatinine Ratio: 8 (calc) (ref 6–22)
BUN: 5 mg/dL — ABNORMAL LOW (ref 7–25)
CO2: 24 mmol/L (ref 20–32)
Calcium: 9.3 mg/dL (ref 8.6–10.2)
Chloride: 101 mmol/L (ref 98–110)
Creat: 0.59 mg/dL (ref 0.50–1.10)
GFR, Est African American: 135 mL/min/{1.73_m2} (ref >=60)
GFR, Est Non African American: 116 mL/min/{1.73_m2} (ref >=60)
Globulin: 3.3 g/dL (calc) (ref 1.9–3.7)
Glucose, Bld: 82 mg/dL (ref 65–99)
Potassium: 4.1 mmol/L (ref 3.5–5.3)
Sodium: 138 mmol/L (ref 135–146)
Total Bilirubin: 0.5 mg/dL (ref 0.2–1.2)
Total Protein: 7.4 g/dL (ref 6.1–8.1)

## 2019-10-20 LAB — IRON,TIBC AND FERRITIN PANEL
%SAT: 31 % (calc) (ref 16–45)
Ferritin: 30 ng/mL (ref 16–154)
Iron: 108 ug/dL (ref 40–190)
TIBC: 346 mcg/dL (calc) (ref 250–450)

## 2019-10-20 LAB — TEST AUTHORIZATION

## 2019-10-20 LAB — HEPATITIS C ANTIBODY
Hepatitis C Ab: NONREACTIVE
SIGNAL TO CUT-OFF: 0.07 (ref <1.00)

## 2019-10-22 ENCOUNTER — Telehealth: Payer: Self-pay

## 2019-10-22 NOTE — Telephone Encounter (Signed)
The pt was notified that her disability forms from [employer] needed to be signed so that they can be faxed because they are completed.  The pt said that she would come today.

## 2019-10-25 DIAGNOSIS — Z23 Encounter for immunization: Secondary | ICD-10-CM

## 2019-10-25 DIAGNOSIS — Z0000 Encounter for general adult medical examination without abnormal findings: Secondary | ICD-10-CM

## 2019-10-29 ENCOUNTER — Other Ambulatory Visit: Payer: Self-pay | Admitting: Obstetrics and Gynecology

## 2019-11-03 ENCOUNTER — Encounter: Payer: Self-pay | Admitting: Internal Medicine

## 2019-11-04 ENCOUNTER — Other Ambulatory Visit: Payer: Self-pay | Admitting: Internal Medicine

## 2019-11-04 DIAGNOSIS — D500 Iron deficiency anemia secondary to blood loss (chronic): Secondary | ICD-10-CM

## 2019-11-13 ENCOUNTER — Encounter (HOSPITAL_BASED_OUTPATIENT_CLINIC_OR_DEPARTMENT_OTHER): Payer: Self-pay | Admitting: Obstetrics and Gynecology

## 2019-11-13 ENCOUNTER — Other Ambulatory Visit: Payer: Self-pay

## 2019-11-13 NOTE — Progress Notes (Signed)
Spoke w/ via phone for pre-op interview---pt Lab needs dos----   Cbc, bmet, t and s, urine preg            Lab results------ekg 10-15-2019 epic COVID test ------positive covid 09-15-2019 no covid test needed Arrive at -------1030 am 11-21-2019 NPO after MN NO Solid Food.  Clear liquids from MN until---930 am then npo Medications to take morning of surgery -----procardia Diabetic medication -----none day of surgery Patient Special Instructions -----none Pre-Op special Istructions -----none Patient verbalized understanding of instructions that were given at this phone interview. Patient denies shortness of breath, chest pain, fever, cough at this phone interview.

## 2019-11-18 ENCOUNTER — Other Ambulatory Visit (HOSPITAL_COMMUNITY): Payer: BC Managed Care – PPO

## 2019-11-20 NOTE — Anesthesia Preprocedure Evaluation (Addendum)
Anesthesia Evaluation  Patient identified by MRN, date of birth, ID band Patient awake    Reviewed: Allergy & Precautions, NPO status , Patient's Chart, lab work & pertinent test results  Airway Mallampati: II  TM Distance: >3 FB Neck ROM: Full    Dental no notable dental hx. (+) Teeth Intact, Dental Advisory Given   Pulmonary neg pulmonary ROS,    Pulmonary exam normal breath sounds clear to auscultation       Cardiovascular hypertension, Pt. on medications Normal cardiovascular exam Rhythm:Regular Rate:Normal     Neuro/Psych negative neurological ROS  negative psych ROS   GI/Hepatic negative GI ROS, Neg liver ROS,   Endo/Other  negative endocrine ROS  Renal/GU negative Renal ROS  negative genitourinary   Musculoskeletal negative musculoskeletal ROS (+)   Abdominal (+) + obese,   Peds  Hematology  (+) anemia ,   Anesthesia Other Findings   Reproductive/Obstetrics negative OB ROS                            Anesthesia Physical Anesthesia Plan  ASA: III  Anesthesia Plan: General   Post-op Pain Management:    Induction: Intravenous  PONV Risk Score and Plan: 4 or greater and Treatment may vary due to age or medical condition, Ondansetron, Dexamethasone, Midazolam and Scopolamine patch - Pre-op  Airway Management Planned: LMA  Additional Equipment: None  Intra-op Plan:   Post-operative Plan:   Informed Consent: I have reviewed the patients History and Physical, chart, labs and discussed the procedure including the risks, benefits and alternatives for the proposed anesthesia with the patient or authorized representative who has indicated his/her understanding and acceptance.     Dental advisory given  Plan Discussed with: CRNA and Anesthesiologist  Anesthesia Plan Comments:        Anesthesia Quick Evaluation

## 2019-11-21 ENCOUNTER — Ambulatory Visit (HOSPITAL_BASED_OUTPATIENT_CLINIC_OR_DEPARTMENT_OTHER)
Admission: RE | Admit: 2019-11-21 | Discharge: 2019-11-21 | Disposition: A | Discharge status: Home / Self Care | Payer: BC Managed Care – PPO | Attending: Obstetrics and Gynecology | Admitting: Obstetrics and Gynecology

## 2019-11-21 ENCOUNTER — Ambulatory Visit (HOSPITAL_BASED_OUTPATIENT_CLINIC_OR_DEPARTMENT_OTHER): Payer: BC Managed Care – PPO | Admitting: Anesthesiology

## 2019-11-21 ENCOUNTER — Encounter (HOSPITAL_BASED_OUTPATIENT_CLINIC_OR_DEPARTMENT_OTHER): Payer: Self-pay | Admitting: Obstetrics and Gynecology

## 2019-11-21 ENCOUNTER — Encounter (HOSPITAL_BASED_OUTPATIENT_CLINIC_OR_DEPARTMENT_OTHER)
Admission: RE | Disposition: A | Discharge status: Home / Self Care | Payer: Self-pay | Source: Home / Self Care | Attending: Obstetrics and Gynecology

## 2019-11-21 DIAGNOSIS — Z8616 Personal history of COVID-19: Secondary | ICD-10-CM | POA: Insufficient documentation

## 2019-11-21 DIAGNOSIS — N979 Female infertility, unspecified: Secondary | ICD-10-CM | POA: Insufficient documentation

## 2019-11-21 HISTORY — DX: Excessive and frequent menstruation with regular cycle: N92.0

## 2019-11-21 HISTORY — PX: HYSTEROSCOPY WITH D & C: SHX1775

## 2019-11-21 HISTORY — DX: Essential (primary) hypertension: I10

## 2019-11-21 LAB — CBC
HCT: 35.4 % — ABNORMAL LOW (ref 36.0–46.0)
Hemoglobin: 11.7 g/dL — ABNORMAL LOW (ref 12.0–15.0)
MCH: 23.2 pg — ABNORMAL LOW (ref 26.0–34.0)
MCHC: 33.1 g/dL (ref 30.0–36.0)
MCV: 70.2 fL — ABNORMAL LOW (ref 80.0–100.0)
Platelets: 323 10*3/uL (ref 150–400)
RBC: 5.04 MIL/uL (ref 3.87–5.11)
RDW: 17.7 % — ABNORMAL HIGH (ref 11.5–15.5)
WBC: 4.3 10*3/uL (ref 4.0–10.5)
nRBC: 0 % (ref 0.0–0.2)

## 2019-11-21 LAB — BASIC METABOLIC PANEL
Anion gap: 12 (ref 5–15)
BUN: 9 mg/dL (ref 6–20)
CO2: 23 mmol/L (ref 22–32)
Calcium: 9.5 mg/dL (ref 8.9–10.3)
Chloride: 101 mmol/L (ref 98–111)
Creatinine, Ser: 0.62 mg/dL (ref 0.44–1.00)
GFR, Estimated: >60 mL/min (ref >60)
Glucose, Bld: 87 mg/dL (ref 70–99)
Potassium: 3.6 mmol/L (ref 3.5–5.1)
Sodium: 136 mmol/L (ref 135–145)

## 2019-11-21 LAB — TYPE AND SCREEN
ABO/RH(D): O POS
Antibody Screen: POSITIVE

## 2019-11-21 LAB — GLUCOSE, CAPILLARY: Glucose-Capillary: 72 mg/dL (ref 70–99)

## 2019-11-21 LAB — POCT PREGNANCY, URINE: Preg Test, Ur: NEGATIVE

## 2019-11-21 SURGERY — DILATATION AND CURETTAGE /HYSTEROSCOPY
Anesthesia: General

## 2019-11-21 MED ORDER — KETOROLAC TROMETHAMINE 30 MG/ML IJ SOLN: 30.0000 mg | Freq: Once | INTRAMUSCULAR | Status: DC | PRN

## 2019-11-21 MED ORDER — SODIUM CHLORIDE 0.9 % IR SOLN
Status: DC | PRN
  Administered 2019-11-21: 3000 mL

## 2019-11-21 MED ORDER — PROPOFOL 10 MG/ML IV BOLUS
INTRAVENOUS | Status: DC | PRN
  Administered 2019-11-21: 200 mg via INTRAVENOUS

## 2019-11-21 MED ORDER — IBUPROFEN 600 MG PO TABS: 600.0000 mg | ORAL_TABLET | Freq: Four times a day (QID) | ORAL | 0 refills | Status: DC | PRN

## 2019-11-21 MED ORDER — LIDOCAINE 2% (20 MG/ML) 5 ML SYRINGE
INTRAMUSCULAR | Status: DC | PRN
  Administered 2019-11-21: 100 mg via INTRAVENOUS

## 2019-11-21 MED ORDER — KETOROLAC TROMETHAMINE 30 MG/ML IJ SOLN
INTRAMUSCULAR | Status: DC | PRN
  Administered 2019-11-21: 30 mg via INTRAVENOUS

## 2019-11-21 MED ORDER — FENTANYL CITRATE (PF) 100 MCG/2ML IJ SOLN
INTRAMUSCULAR | Status: AC
  Filled 2019-11-21: qty 2

## 2019-11-21 MED ORDER — HYDROMORPHONE HCL 1 MG/ML IJ SOLN: 0.2500 mg | INTRAMUSCULAR | Status: DC | PRN

## 2019-11-21 MED ORDER — ONDANSETRON HCL 4 MG/2ML IJ SOLN
INTRAMUSCULAR | Status: AC
  Filled 2019-11-21: qty 2

## 2019-11-21 MED ORDER — DEXAMETHASONE SODIUM PHOSPHATE 10 MG/ML IJ SOLN
INTRAMUSCULAR | Status: AC
  Filled 2019-11-21: qty 1

## 2019-11-21 MED ORDER — ONDANSETRON HCL 4 MG/2ML IJ SOLN
INTRAMUSCULAR | Status: DC | PRN
  Administered 2019-11-21: 4 mg via INTRAVENOUS

## 2019-11-21 MED ORDER — DEXAMETHASONE SODIUM PHOSPHATE 10 MG/ML IJ SOLN
INTRAMUSCULAR | Status: DC | PRN
  Administered 2019-11-21: 5 mg via INTRAVENOUS

## 2019-11-21 MED ORDER — ONDANSETRON HCL 4 MG/2ML IJ SOLN: 4.0000 mg | Freq: Once | INTRAMUSCULAR | Status: DC | PRN

## 2019-11-21 MED ORDER — FENTANYL CITRATE (PF) 100 MCG/2ML IJ SOLN
INTRAMUSCULAR | Status: DC | PRN
  Administered 2019-11-21 (×2): 50 ug via INTRAVENOUS

## 2019-11-21 MED ORDER — LIDOCAINE 2% (20 MG/ML) 5 ML SYRINGE
INTRAMUSCULAR | Status: AC
  Filled 2019-11-21: qty 5

## 2019-11-21 MED ORDER — MIDAZOLAM HCL 2 MG/2ML IJ SOLN
INTRAMUSCULAR | Status: AC
  Filled 2019-11-21: qty 2

## 2019-11-21 MED ORDER — OXYCODONE HCL 5 MG/5ML PO SOLN: 5.0000 mg | Freq: Once | ORAL | Status: DC | PRN

## 2019-11-21 MED ORDER — MIDAZOLAM HCL 2 MG/2ML IJ SOLN
INTRAMUSCULAR | Status: DC | PRN
  Administered 2019-11-21: 2 mg via INTRAVENOUS

## 2019-11-21 MED ORDER — OXYCODONE-ACETAMINOPHEN 2.5-325 MG PO TABS
1.0000 | ORAL_TABLET | ORAL | 0 refills | Status: DC | PRN
Start: 2019-11-21 — End: 2021-01-25

## 2019-11-21 MED ORDER — PROPOFOL 10 MG/ML IV BOLUS
INTRAVENOUS | Status: AC
  Filled 2019-11-21: qty 20

## 2019-11-21 MED ORDER — OXYCODONE HCL 5 MG PO TABS: 5.0000 mg | ORAL_TABLET | Freq: Once | ORAL | Status: DC | PRN

## 2019-11-21 MED ORDER — KETOROLAC TROMETHAMINE 30 MG/ML IJ SOLN
INTRAMUSCULAR | Status: AC
  Filled 2019-11-21: qty 1

## 2019-11-21 MED ORDER — LACTATED RINGERS IV SOLN: INTRAVENOUS | Status: DC

## 2019-11-21 MED ORDER — POVIDONE-IODINE 10 % EX SWAB: 2.0000 "application " | Freq: Once | CUTANEOUS | Status: DC

## 2019-11-21 MED ORDER — DEXMEDETOMIDINE HCL 200 MCG/2ML IV SOLN
INTRAVENOUS | Status: DC | PRN
  Administered 2019-11-21: 12 ug via INTRAVENOUS

## 2019-11-21 MED ORDER — DEXMEDETOMIDINE (PRECEDEX) IN NS 20 MCG/5ML (4 MCG/ML) IV SYRINGE
PREFILLED_SYRINGE | INTRAVENOUS | Status: AC
  Filled 2019-11-21: qty 5

## 2019-11-21 MED ORDER — LIDOCAINE HCL 2 % IJ SOLN
INTRAMUSCULAR | Status: DC | PRN
  Administered 2019-11-21: 18 mL

## 2019-11-21 SURGICAL SUPPLY — 15 items
CANISTER SUCT 3000ML PPV (MISCELLANEOUS) ×2 IMPLANT
CATH ROBINSON RED A/P 16FR (CATHETERS) ×2 IMPLANT
DILATOR CANAL MILEX (MISCELLANEOUS) IMPLANT
GLOVE BIO SURGEON STRL SZ 6.5 (GLOVE) ×2 IMPLANT
GLOVE BIOGEL PI IND STRL 7.0 (GLOVE) ×1 IMPLANT
GLOVE BIOGEL PI INDICATOR 7.0 (GLOVE) ×1
GOWN STRL REUS W/TWL LRG LVL3 (GOWN DISPOSABLE) ×4 IMPLANT
IV NS IRRIG 3000ML ARTHROMATIC (IV SOLUTION) ×2 IMPLANT
KIT PROCEDURE FLUENT (KITS) ×2 IMPLANT
KIT TURNOVER CYSTO (KITS) ×2 IMPLANT
PACK VAGINAL MINOR WOMEN LF (CUSTOM PROCEDURE TRAY) ×2 IMPLANT
PAD OB MATERNITY 4.3X12.25 (PERSONAL CARE ITEMS) ×2 IMPLANT
SET GENESYS HTA PROCERVA (MISCELLANEOUS) ×2 IMPLANT
TOWEL OR 17X26 10 PK STRL BLUE (TOWEL DISPOSABLE) ×2 IMPLANT
UNDERPAD 30X36 HEAVY ABSORB (UNDERPADS AND DIAPERS) ×2 IMPLANT

## 2019-11-21 NOTE — Anesthesia Postprocedure Evaluation (Signed)
Anesthesia Post Note  Patient: Adrienne Benjamin  Procedure(s) Performed: DILATATION AND CURETTAGE /HYSTEROSCOPY (N/A )     Patient location during evaluation: PACU Anesthesia Type: General Level of consciousness: awake and alert Pain management: pain level controlled Vital Signs Assessment: post-procedure vital signs reviewed and stable Respiratory status: spontaneous breathing, nonlabored ventilation, respiratory function stable and patient connected to nasal cannula oxygen Cardiovascular status: blood pressure returned to baseline and stable Postop Assessment: no apparent nausea or vomiting Anesthetic complications: no   No complications documented.  Last Vitals:  Vitals:   11/21/19 1405 11/21/19 1500  BP:  125/80  Pulse: 62 62  Resp: 18 16  Temp: 36.8 C 36.7 C  SpO2: 98% 99%    Last Pain:  Vitals:   11/21/19 1500  TempSrc:   PainSc: 3                  Trevor Iha

## 2019-11-21 NOTE — Discharge Instructions (Signed)
Post Anesthesia Home Care Instructions  Activity: Get plenty of rest for the remainder of the day. A responsible adult should stay with you for 24 hours following the procedure.  For the next 24 hours, DO NOT: -Drive a car -Advertising copywriter -Drink alcoholic beverages -Take any medication unless instructed by your physician -Make any legal decisions or sign important papers.  Meals: Start with liquid foods such as gelatin or soup. Progress to regular foods as tolerated. Avoid greasy, spicy, heavy foods. If nausea and/or vomiting occur, drink only clear liquids until the nausea and/or vomiting subsides. Call your physician if vomiting continues.  Special Instructions/Symptoms: Your throat may feel dry or sore from the anesthesia or the breathing tube placed in your throat during surgery. If this causes discomfort, gargle with warm salt water. The discomfort should disappear within 24 hours.  If you had a scopolamine patch placed behind your ear for the management of post- operative nausea and/or vomiting:  1. The medication in the patch is effective for 72 hours, after which it should be removed.  Wrap patch in a tissue and discard in the trash. Wash hands thoroughly with soap and water. 2. You may remove the patch earlier than 72 hours if you experience unpleasant side effects which may include dry mouth, dizziness or visual disturbances. 3. Avoid touching the patch. Wash your hands with soap and water after contact with the patch.   DISCHARGE INSTRUCTIONS: D&C / D&E The following instructions have been prepared to help you care for yourself upon your return home.   Personal hygiene: Marland Kitchen Use sanitary pads for vaginal drainage, not tampons. . Shower the day after your procedure. . NO tub baths, pools or Jacuzzis for 2-3 weeks. . Wipe front to back after using the bathroom.  Activity and limitations: . Do NOT drive or operate any equipment for 24 hours. The effects of anesthesia are  still present and drowsiness may result. . Do NOT rest in bed all day. . Walking is encouraged. . Walk up and down stairs slowly. . You may resume your normal activity in one to two days or as indicated by your physician.  Sexual activity: NO intercourse for at least 2 weeks after the procedure, or as indicated by your physician.  Diet: Eat a light meal as desired this evening. You may resume your usual diet tomorrow.  Return to work: You may resume your work activities in one to two days or as indicated by your doctor.  What to expect after your surgery: Expect to have vaginal bleeding/discharge for 2-3 days and spotting for up to 10 days. It is not unusual to have soreness for up to 1-2 weeks. You may have a slight burning sensation when you urinate for the first day. Mild cramps may continue for a couple of days. You may have a regular period in 2-6 weeks.  Call your doctor for any of the following: . Excessive vaginal bleeding, saturating and changing one pad every hour. . Inability to urinate 6 hours after discharge from hospital. . Pain not relieved by pain medication. . Fever of 100.4 F or greater. . Unusual vaginal discharge or odor.   Call for an appointment:     Hysteroscopy Hysteroscopy is a procedure that is used to examine the inside of a woman's womb (uterus). This may be done for various reasons, including:  To look for lumps (tumors) and other growths in the uterus.  To evaluate abnormal bleeding, fibroid tumors, polyps, scar tissue (adhesions), or  cancer of the uterus.  To determine the cause of an inability to get pregnant (infertility) or repeated losses of pregnancies (miscarriages).  To find a lost IUD (intrauterine device).  To perform a procedure that permanently prevents pregnancy (sterilization). During this procedure, a thin, flexible tube with a small light and camera (hysteroscope) is used to examine the uterus. The camera sends images to a monitor in  the room so that your health care provider can view the inside of your uterus. A hysteroscopy should be done right after a menstrual period to make sure that you are not pregnant. Tell a health care provider about:  Any allergies you have.  All medicines you are taking, including vitamins, herbs, eye drops, creams, and over-the-counter medicines.  Any problems you or family members have had with the use of anesthetic medicines.  Any blood disorders you have.  Any surgeries you have had.  Any medical conditions you have.  Whether you are pregnant or may be pregnant. What are the risks? Generally, this is a safe procedure. However, problems may occur, including:  Excessive bleeding.  Infection.  Damage to the uterus or other structures or organs.  Allergic reaction to medicines or fluids that are used in the procedure. What happens before the procedure? Staying hydrated Follow instructions from your health care provider about hydration, which may include:  Up to 2 hours before the procedure - you may continue to drink clear liquids, such as water, clear fruit juice, black coffee, and plain tea. Eating and drinking restrictions Follow instructions from your health care provider about eating and drinking, which may include:  8 hours before the procedure - stop eating solid foods and drink clear liquids only  2 hours before the procedure - stop drinking clear liquids. General instructions  Ask your health care provider about: ? Changing or stopping your normal medicines. This is important if you take diabetes medicines or blood thinners. ? Taking medicines such as aspirin and ibuprofen. These medicines can thin your blood and cause bleeding. Do not take these medicines for 1 week before your procedure, or as told by your health care provider.  Do not use any products that contain nicotine or tobacco for 2 weeks before the procedure. This includes cigarettes and e-cigarettes. If  you need help quitting, ask your health care provider.  Medicine may be placed in your cervix the day before the procedure. This medicine causes the cervix to have a larger opening (dilate). The larger opening makes it easier for the hysteroscope to be inserted into the uterus during the procedure.  Plan to have someone with you for the first 24-48 hours after the procedure, especially if you are given a medicine to make you fall asleep (general anesthetic).  Plan to have someone take you home from the hospital or clinic. What happens during the procedure?  To lower your risk of infection: ? Your health care team will wash or sanitize their hands. ? Your skin will be washed with soap. ? Hair may be removed from the surgical area.  An IV tube will be inserted into one of your veins.  You may be given one or more of the following: ? A medicine to help you relax (sedative). ? A medicine that numbs the area around the cervix (local anesthetic). ? A medicine to make you fall asleep (general anesthetic).  A hysteroscope will be inserted through your vagina and into your uterus.  Air or fluid will be used to enlarge your  uterus, enabling your health care provider to see your uterus better. The amount of fluid used will be carefully checked throughout the procedure.  In some cases, tissue may be gently scraped from inside the uterus and sent to a lab for testing (biopsy). The procedure may vary among health care providers and hospitals. What happens after the procedure?  Your blood pressure, heart rate, breathing rate, and blood oxygen level will be monitored until the medicines you were given have worn off.  You may have some cramping. You may be given medicines for this.  You may have bleeding, which varies from light spotting to menstrual-like bleeding. This is normal.  If you had a biopsy done, it is your responsibility to get the results of your procedure. Ask your health care  provider, or the department performing the procedure, when your results will be ready. Summary  Hysteroscopy is a procedure that is used to examine the inside of a woman's womb (uterus).  After the procedure, you may have bleeding, which varies from light spotting to menstrual-like bleeding. This is normal. You may also have cramping.  Plan to have someone take you home from the hospital or clinic. This information is not intended to replace advice given to you by your health care provider. Make sure you discuss any questions you have with your health care provider. Document Revised: 12/01/2016 Document Reviewed: 01/18/2016 Elsevier Patient Education  2020 [employer].

## 2019-11-21 NOTE — Anesthesia Procedure Notes (Signed)
Procedure Name: LMA Insertion Date/Time: 11/21/2019 12:30 PM Performed by: Francie Massing, CRNA Pre-anesthesia Checklist: Patient identified, Emergency Drugs available, Suction available and Patient being monitored Patient Re-evaluated:Patient Re-evaluated prior to induction Oxygen Delivery Method: Circle system utilized Preoxygenation: Pre-oxygenation with 100% oxygen Induction Type: IV induction Ventilation: Mask ventilation without difficulty LMA: LMA inserted LMA Size: 5.0 Number of attempts: 1 Airway Equipment and Method: Bite block Placement Confirmation: positive ETCO2 Tube secured with: Tape Dental Injury: Teeth and Oropharynx as per pre-operative assessment

## 2019-11-21 NOTE — Transfer of Care (Signed)
Immediate Anesthesia Transfer of Care Note  Patient: Adrienne Benjamin  Procedure(s) Performed: Procedure(s) (LRB): DILATATION AND CURETTAGE /HYSTEROSCOPY (N/A)  Patient Location: PACU  Anesthesia Type: General  Level of Consciousness: awake, oriented, sedated and patient cooperative  Airway & Oxygen Therapy: Patient Spontanous Breathing and Patient connected to face mask oxygen  Post-op Assessment: Report given to PACU RN and Post -op Vital signs reviewed and stable  Post vital signs: Reviewed and stable  Complications: No apparent anesthesia complications Last Vitals:  Vitals Value Taken Time  BP 178/105 11/21/19 1309  Temp    Pulse 80 11/21/19 1310  Resp 14 11/21/19 1310  SpO2 100 % 11/21/19 1310  Vitals shown include unvalidated device data.  Last Pain:  Vitals:   11/21/19 1045  TempSrc: Oral  PainSc: 0-No pain      Patients Stated Pain Goal: 5 (11/21/19 1045)  Complications: No complications documented.

## 2019-11-21 NOTE — H&P (Signed)
Adrienne Benjamin y.o. female. Who presents for a D&C hysteroscopy due to possible synechiae found on HSG   necological History: Contraception: Education given regarding options for contraception, including none. Blood transfusions: na Sexually transmitted diseases: trichomonas Previous GYN Procedures: L/s ovarian cystectomy Last mammogram:  Last pap: normal Date: WNL 2020 OB History: 0   Menstrual History: Menarche age: 39 Patient's last menstrual period was 11/01/2019.    Past Medical History:  Diagnosis Date  . Abnormal Pap smear 3/11   ascus/ hpv  . Anemia   . Complex ovarian cyst   . Endometriosis 3/07  . Heavy menstrual bleeding   . Hypertension   . Insulin resistance    last few years  . Insulin resistance    Past Surgical History:  Procedure Laterality Date  . impacted wisdom teeth  2006  . OVARIAN CYST REMOVAL  2005    Current Facility-Administered Medications:  .  lactated ringers infusion, , Intravenous, Continuous, Hatchett, Franklin, MD, Last Rate: 50 mL/hr at 11/21/19 1104, New Bag at 11/21/19 1104 .  povidone-iodine 10 % swab 2 application, 2 application, Topical, Once, Henryk Ursin, MD Allergies  Allergen Reactions  . Ciprofloxacin Nausea And Vomiting   Review of Systems - Negative except as above   Physical Exam  BP (!) 149/103   Pulse 73   Temp 98.8 F (37.1 C) (Oral)   Resp (!) 22   Ht 6\' 2"  (1.88 m)   Wt (!) 144.4 kg   LMP 11/01/2019   SpO2 100%   BMI 40.87 kg/m  Constitutional: She appears well-developed and well-nourished.  HENT:  Head: Normocephalic.  Eyes: Pupils are equal, round, and reactive to light.  Neck: Normal range of motion. Neck supple.  Cardiovascular: Regular rhythm.   Respiratory: Effort normal and breath sounds normal.  GI: Soft.  Genitourinary:v/v WNL  Difficult to tell uterine size due to body habitus.   Musculoskeletal: Normal range of motion.  Neurological: She is alert.  Skin: Skin is warm.  Psychiatric:  She has a normal mood and affect.  Results for orders placed or performed during the hospital encounter of 11/21/19 (from the past 72 hour(s))  Pregnancy, urine POC     Status: None   Collection Time: 11/21/19 10:23 AM  Result Value Ref Range   Preg Test, Ur NEGATIVE NEGATIVE    Comment:        THE SENSITIVITY OF THIS METHODOLOGY IS >24 mIU/mL   CBC     Status: Abnormal   Collection Time: 11/21/19 11:00 AM  Result Value Ref Range   WBC 4.3 4.0 - 10.5 K/uL   RBC 5.04 3.87 - 5.11 MIL/uL   Hemoglobin 11.7 (L) 12.0 - 15.0 g/dL   HCT 11/23/19 (L) 36 - 46 %   MCV 70.2 (L) 80.0 - 100.0 fL   MCH 23.2 (L) 26.0 - 34.0 pg   MCHC 33.1 30.0 - 36.0 g/dL   RDW 93.2 (H) 35.5 - 73.2 %   Platelets 323 150 - 400 K/uL   nRBC 0.0 0.0 - 0.2 %    Comment: Performed at [facility], [address] [place], [place]  Type and screen     Status: None (Preliminary result)   Collection Time: 11/21/19 11:05 AM  Result Value Ref Range   ABO/RH(D) PENDING    Antibody Screen PENDING    Sample Expiration      11/24/2019,2359 Performed at [facility], 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  Risks are but not limited to bleeding, infection, scarring of the uterus and perforation.   Date of Initial H&P: today  History reviewed, patient examined, no change in status, stable for surgery.  Justn Quale A 11/21/2010, 11:41 AM

## 2019-11-21 NOTE — Op Note (Signed)
Pre op DX Infertility Possible uterine synechiae   Post Op [id number]   PHYSICIAN : Ivaan Liddy   ASSISTANTS: none   ANESTHESIA:   General  and paracervical block  ESTIMATED BLOOD LOSS: minimal  LOCAL MEDICATIONS USED:  LIDOCAINE 18CC  SPECIMEN:  Source of Specimen:  endometrial curettings  DISPOSITION OF SPECIMEN:  PATHOLOGY  COUNTS Correct:  YES    DICTATION #: The patient was taken to the operating room and prepped and draped in a normal sterile fashion. An in out catheter was used to drain the bladder.   A bivalve speculum was placed into the vagina and anterior lip of the cervix was grasped with a single-tooth tenaculum.  18 cc of 1% lidocaine was used for cervical block.  the cervix was then dilated with Shawnie Pons dilators up to 19. The hysteroscope was placed into the uterine cavity. The  entire uterus and both ostia were visualized.   Hyseroscope was then removed from the uterus.no adhesions were seen just abundant endometrium.   A sharp curettage was then done with a curette and endometrial curettings were obtained. The endometrial curettings were sent to pathology. Again the hysteroscope was placed into the uterine cavity. Both ostia were again visualized there were no polyps or submucosal fibroids or endometrial masses seen. The tenaculum was removed from the cervix and hemostasis was noted.   PLAN OF CARE: discharge to home  PATIENT DISPOSITION:  PACU - hemodynamically stable.

## 2019-11-24 ENCOUNTER — Encounter (HOSPITAL_BASED_OUTPATIENT_CLINIC_OR_DEPARTMENT_OTHER): Payer: Self-pay | Admitting: Obstetrics and Gynecology

## 2019-11-24 LAB — SURGICAL PATHOLOGY

## 2019-12-01 ENCOUNTER — Other Ambulatory Visit: Payer: Self-pay | Admitting: Internal Medicine

## 2019-12-01 DIAGNOSIS — I10 Essential (primary) hypertension: Secondary | ICD-10-CM

## 2019-12-06 ENCOUNTER — Encounter: Payer: Self-pay | Admitting: Internal Medicine

## 2020-01-02 ENCOUNTER — Other Ambulatory Visit: Payer: Self-pay | Admitting: Internal Medicine

## 2020-01-20 ENCOUNTER — Encounter: Payer: Self-pay | Admitting: Internal Medicine

## 2020-01-29 ENCOUNTER — Other Ambulatory Visit: Payer: Self-pay | Admitting: Internal Medicine

## 2020-02-19 ENCOUNTER — Ambulatory Visit: Payer: Self-pay

## 2020-02-20 ENCOUNTER — Ambulatory Visit: Payer: Self-pay

## 2020-02-24 ENCOUNTER — Encounter: Payer: Self-pay | Admitting: Obstetrics and Gynecology

## 2020-02-26 ENCOUNTER — Other Ambulatory Visit: Payer: Self-pay

## 2020-02-26 DIAGNOSIS — R7303 Prediabetes: Secondary | ICD-10-CM

## 2020-02-26 MED ORDER — OZEMPIC (0.25 OR 0.5 MG/DOSE) 2 MG/1.5ML ~~LOC~~ SOPN: PEN_INJECTOR | SUBCUTANEOUS | 2 refills | Status: DC

## 2020-03-03 ENCOUNTER — Other Ambulatory Visit: Payer: Self-pay | Admitting: Internal Medicine

## 2020-03-03 ENCOUNTER — Encounter: Payer: Self-pay | Admitting: Internal Medicine

## 2020-03-03 DIAGNOSIS — I10 Essential (primary) hypertension: Secondary | ICD-10-CM

## 2020-03-15 ENCOUNTER — Other Ambulatory Visit: Payer: Self-pay

## 2020-03-15 DIAGNOSIS — R7303 Prediabetes: Secondary | ICD-10-CM

## 2020-03-15 MED ORDER — OZEMPIC (0.25 OR 0.5 MG/DOSE) 2 MG/1.5ML ~~LOC~~ SOPN: PEN_INJECTOR | SUBCUTANEOUS | 2 refills | Status: DC

## 2020-03-16 ENCOUNTER — Other Ambulatory Visit: Payer: Self-pay

## 2020-03-16 DIAGNOSIS — R7303 Prediabetes: Secondary | ICD-10-CM

## 2020-03-16 MED ORDER — OZEMPIC (0.25 OR 0.5 MG/DOSE) 2 MG/1.5ML ~~LOC~~ SOPN: PEN_INJECTOR | SUBCUTANEOUS | 2 refills | Status: DC

## 2020-03-19 ENCOUNTER — Encounter: Payer: Self-pay | Admitting: Internal Medicine

## 2020-03-23 ENCOUNTER — Ambulatory Visit: Payer: BC Managed Care – PPO | Admitting: Nurse Practitioner

## 2020-03-23 ENCOUNTER — Other Ambulatory Visit: Payer: Self-pay

## 2020-03-23 VITALS — BP 138/84 | HR 86 | Temp 99.0°F | Ht 74.0 in | Wt 312.0 lb

## 2020-03-23 DIAGNOSIS — Z6841 Body Mass Index (BMI) 40.0 and over, adult: Secondary | ICD-10-CM | POA: Diagnosis not present

## 2020-03-23 DIAGNOSIS — R350 Frequency of micturition: Secondary | ICD-10-CM

## 2020-03-23 DIAGNOSIS — E66813 Obesity, class 3: Secondary | ICD-10-CM

## 2020-03-23 DIAGNOSIS — N3001 Acute cystitis with hematuria: Secondary | ICD-10-CM | POA: Diagnosis not present

## 2020-03-23 LAB — POCT URINALYSIS DIPSTICK
Glucose, UA: NEGATIVE
Ketones, UA: NEGATIVE
Nitrite, UA: POSITIVE
Protein, UA: POSITIVE — AB
Spec Grav, UA: 1.025 (ref 1.010–1.025)
Urobilinogen, UA: 0.2 E.U./dL
pH, UA: 6 (ref 5.0–8.0)

## 2020-03-23 MED ORDER — NITROFURANTOIN MONOHYD MACRO 100 MG PO CAPS: 100.0000 mg | ORAL_CAPSULE | Freq: Two times a day (BID) | ORAL | 0 refills | Status: AC

## 2020-03-23 NOTE — Progress Notes (Signed)
I,Tianna Badgett,acting as a Neurosurgeon for [employer], FNP.,have documented all relevant documentation on the behalf of Arnette Felts, FNP,as directed by  Arnette Felts, FNP while in the presence of Arnette Felts, FNP.  This visit occurred during the SARS-CoV-2 public health emergency.  Safety protocols were in place, including screening questions prior to the visit, additional usage of staff PPE, and extensive cleaning of exam room while observing appropriate contact time as indicated for disinfecting solutions.  Subjective:     Patient ID: Adrienne Benjamin , female    DOB: [birth year] , 40 y.o.   MRN: [id number]   Chief Complaint  Patient presents with  . Urinary Frequency    HPI  Patient is here for urinary frequency.   Urinary Frequency  This is a new problem. The current episode started in the past 7 days (On Friday). There has been no fever. Associated symptoms include frequency and urgency. Pertinent negatives include no chills, flank pain, hesitancy, nausea or vomiting. She has tried nothing for the symptoms. Her past medical history is significant for recurrent UTIs (once). There is no history of catheterization.     Past Medical History:  Diagnosis Date  . Abnormal Pap smear 3/11   ascus/ hpv  . Anemia   . Complex ovarian cyst   . Endometriosis 3/07  . Heavy menstrual bleeding   . Hypertension   . Insulin resistance    last few years  . Insulin resistance      Family History  Problem Relation Age of Onset  . Diabetes Father   . Hypertension Mother      Current Outpatient Medications:  .  Cholecalciferol (VITAMIN D) 50 MCG (2000 UT) CAPS, Take by mouth. daily, Disp: , Rfl:  .  folic acid (FOLVITE) 1 MG tablet, Take 1 mg by mouth daily., Disp: , Rfl:  .  hydrochlorothiazide (MICROZIDE) 12.5 MG capsule, Take 1 capsule by mouth once daily (Patient taking differently: daily. ), Disp: 90 capsule, Rfl: 2 .  ibuprofen (ADVIL) 600 MG tablet, Take 1 tablet (600 mg total) by  mouth every 6 (six) hours as needed., Disp: 30 tablet, Rfl: 0 .  Iron-Folic Acid-B12-C-Docusate (FERRAPLUS 90) 90-1 MG TABS, Take 1 tablet by mouth once daily, Disp: 90 tablet, Rfl: 0 .  metFORMIN (GLUCOPHAGE) 500 MG tablet, TAKE 1 TABLET BY MOUTH THREE TIMES DAILY WITH MEALS, Disp: 90 tablet, Rfl: 0 .  NIFEdipine (PROCARDIA-XL/NIFEDICAL-XL) 30 MG 24 hr tablet, Take 1 tablet by mouth once daily, Disp: 90 tablet, Rfl: 0 .  Omega-3 1000 MG CAPS, Take by mouth. 2 per day, Disp: , Rfl:  .  oxycodone-acetaminophen (PERCOCET) 2.5-325 MG tablet, Take 1 tablet by mouth every 4 (four) hours as needed for pain., Disp: 30 tablet, Rfl: 0 .  Prenatal Vit-Fe Fumarate-FA (PRENATAL MULTIVITAMIN) TABS, Take 1 tablet by mouth daily., Disp: , Rfl:  .  Semaglutide,0.25 or 0.5MG /DOS, (OZEMPIC, 0.25 OR 0.5 MG/DOSE,) 2 MG/1.5ML SOPN, INJECT 0.5mg  into skin weekly., Disp: 4.5 mL, Rfl: 2   Allergies  Allergen Reactions  . Ciprofloxacin Nausea And Vomiting     Review of Systems  Constitutional: Negative.  Negative for chills.  Respiratory: Negative.   Cardiovascular: Negative.  Negative for chest pain, palpitations and leg swelling.  Gastrointestinal: Negative.  Negative for nausea and vomiting.  Genitourinary: Positive for dysuria, frequency and urgency. Negative for flank pain, hesitancy and vaginal pain.  Neurological: Negative.   Psychiatric/Behavioral: Negative.      Today's Vitals   03/23/20 1544  BP: 138/84  Pulse: 86  Temp: 99 F (37.2 C)  TempSrc: Oral  Weight: (!) 312 lb (141.5 kg)  Height: 6\' 2"  (1.88 m)   Body mass index is 40.06 kg/m.   Objective:  Physical Exam Vitals reviewed.  Constitutional:      General: She is not in acute distress.    Appearance: Normal appearance. She is obese.  Cardiovascular:     Pulses: Normal pulses.     Heart sounds: Normal heart sounds. No murmur heard.   Pulmonary:     Effort: Pulmonary effort is normal. No respiratory distress.     Breath sounds:  Normal breath sounds. No wheezing.  Abdominal:     Tenderness: There is no right CVA tenderness or left CVA tenderness.  Neurological:     General: No focal deficit present.     Mental Status: She is alert and oriented to person, place, and time.     Cranial Nerves: No cranial nerve deficit.     Motor: No weakness.  Psychiatric:        Mood and Affect: Mood normal.        Behavior: Behavior normal.        Thought Content: Thought content normal.        Judgment: Judgment normal.         Assessment And Plan:     1. Acute cystitis with hematuria  Urinalysis is positive for nitrates  Will treat for urinary tract infection   Will send urine for culture - nitrofurantoin, macrocrystal-monohydrate, (MACROBID) 100 MG capsule; Take 1 capsule (100 mg total) by mouth 2 (two) times daily for 5 days.  Dispense: 10 capsule; Refill: 0 - Culture, Urine  2. Urinary frequency - POCT Urinalysis Dipstick (81002)  3. Class 3 severe obesity due to excess calories with serious comorbidity and body mass index (BMI) of 40.0 to 44.9 in adult [facility])  Chronic  Discussed healthy diet and regular exercise options   She is encouraged to strive for BMI less than 30 to decrease cardiac risk. Advised to aim for at least 150 minutes of exercise per week.with 2 days of strength training   Patient was given opportunity to ask questions. Patient verbalized understanding of the plan and was able to repeat key elements of the plan. All questions were answered to their satisfaction.  [facility], FNP   I, Arnette Felts, FNP, have reviewed all documentation for this visit. The documentation on 03/23/20 for the exam, diagnosis, procedures, and orders are all accurate and complete.   IF YOU HAVE BEEN REFERRED TO A SPECIALIST, IT MAY TAKE 1-2 WEEKS TO SCHEDULE/PROCESS THE REFERRAL. IF YOU HAVE NOT HEARD FROM US/SPECIALIST IN TWO WEEKS, PLEASE GIVE 03/25/20 A CALL AT [phone] X [phone].   THE PATIENT IS ENCOURAGED TO  PRACTICE SOCIAL DISTANCING DUE TO THE COVID-19 PANDEMIC.

## 2020-03-23 NOTE — Patient Instructions (Signed)
Urinary Frequency, Adult Urinary frequency means urinating more often than usual. You may urinate every 1-2 hours even though you drink a normal amount of fluid and do not have a bladder infection or condition. Although you urinate more often than normal,the total amount of urine produced in a day is normal. With urinary frequency, you may have an urgent need to urinate often. The stress and anxiety of needing to find a bathroom quickly can make this urge worse. This condition may go away on its own or you may need treatment at home. Home treatment may include bladder training, exercises, taking medicines, ormaking changes to your diet. Follow these instructions at home: Bladder health  Keep a bladder diary if told by your health care provider. Keep track of: What you eat and drink. How often you urinate. How much you urinate. Follow a bladder training program if told by your health care provider. This may include: Learning to delay going to the bathroom. Double urinating (voiding). This helps if you are not completely emptying your bladder. Scheduled voiding. Do Kegel exercises as told by your health care provider. Kegel exercises strengthen the muscles that help control urination, which may help the condition.  Eating and drinking If told by your health care provider, make diet changes, such as: Avoiding caffeine. Drinking fewer fluids, especially alcohol. Not drinking in the evening. Avoiding foods or drinks that may irritate the bladder. These include coffee, tea, soda, artificial sweeteners, citrus, tomato-based foods, and chocolate. Eating foods that help prevent or ease constipation. Constipation can make this condition worse. Your health care provider may recommend that you: Drink enough fluid to keep your urine pale yellow. Take over-the-counter or prescription medicines. Eat foods that are high in fiber, such as beans, whole grains, and fresh fruits and vegetables. Limit foods  that are high in fat and processed sugars, such as fried or sweet foods. General instructions Take over-the-counter and prescription medicines only as told by your health care provider. Keep all follow-up visits as told by your health care provider. This is important. Contact a health care provider if: You start urinating more often. You feel pain or irritation when you urinate. You notice blood in your urine. Your urine looks cloudy. You develop a fever. You begin vomiting. Get help right away if: You are unable to urinate. Summary Urinary frequency means urinating more often than usual. With urinary frequency, you may urinate every 1-2 hours even though you drink a normal amount of fluid and do not have a bladder infection or other bladder condition. Your health care provider may recommend that you keep a bladder diary, follow a bladder training program, or make dietary changes. If told by your health care provider, do Kegel exercises to strengthen the muscles that help control urination. Take over-the-counter and prescription medicines only as told by your health care provider. Contact a health care provider if your symptoms do not improve or get worse. This information is not intended to replace advice given to you by your health care provider. Make sure you discuss any questions you have with your healthcare provider. Document Revised: 06/28/2017 Document Reviewed: 06/28/2017 Elsevier Patient Education  2021 [employer] Inc.  

## 2020-03-27 LAB — URINE CULTURE

## 2020-03-31 ENCOUNTER — Encounter: Payer: Self-pay | Admitting: Nurse Practitioner

## 2020-04-04 ENCOUNTER — Other Ambulatory Visit: Payer: Self-pay | Admitting: Internal Medicine

## 2020-04-14 ENCOUNTER — Other Ambulatory Visit: Payer: Self-pay

## 2020-04-14 ENCOUNTER — Encounter: Payer: Self-pay | Admitting: Internal Medicine

## 2020-04-14 ENCOUNTER — Ambulatory Visit: Payer: BC Managed Care – PPO | Admitting: Internal Medicine

## 2020-04-14 VITALS — BP 112/76 | HR 79 | Temp 98.1°F | Ht 74.0 in | Wt 308.8 lb

## 2020-04-14 DIAGNOSIS — I10 Essential (primary) hypertension: Secondary | ICD-10-CM | POA: Diagnosis not present

## 2020-04-14 DIAGNOSIS — Z6839 Body mass index (BMI) 39.0-39.9, adult: Secondary | ICD-10-CM

## 2020-04-14 DIAGNOSIS — D500 Iron deficiency anemia secondary to blood loss (chronic): Secondary | ICD-10-CM

## 2020-04-14 DIAGNOSIS — R7303 Prediabetes: Secondary | ICD-10-CM | POA: Diagnosis not present

## 2020-04-14 DIAGNOSIS — E66812 Obesity, class 2: Secondary | ICD-10-CM

## 2020-04-14 LAB — BASIC METABOLIC PANEL
BUN: 9 (ref 4–21)
CO2: 25 — AB (ref 13–22)
Chloride: 103 (ref 99–108)
Creatinine: 0.6 (ref 0.5–1.1)
Glucose: 81
Potassium: 4.2 (ref 3.4–5.3)
Sodium: 139 (ref 137–147)

## 2020-04-14 LAB — IRON,TIBC AND FERRITIN PANEL
%SAT: 23
Ferritin: 24
Iron: 77
TIBC: 339

## 2020-04-14 LAB — COMPREHENSIVE METABOLIC PANEL
Albumin: 3.8 (ref 3.5–5.0)
Calcium: 8.4 — AB (ref 8.7–10.7)
GFR calc Af Amer: 132
GFR calc non Af Amer: 113
Globulin: 3.1

## 2020-04-14 LAB — CBC: RBC: 4.69 (ref 3.87–5.11)

## 2020-04-14 LAB — CBC AND DIFFERENTIAL
HCT: 34 — AB (ref 36–46)
Hemoglobin: 10.5 — AB (ref 12.0–16.0)
Platelets: 359 (ref 150–399)
WBC: 4.6

## 2020-04-14 LAB — HEPATIC FUNCTION PANEL
ALT: 6 — AB (ref 7–35)
AST: 12 — AB (ref 13–35)
Alkaline Phosphatase: 54 (ref 25–125)
Bilirubin, Total: 0.4

## 2020-04-14 LAB — HEMOGLOBIN A1C: Hemoglobin A1C: 5.5

## 2020-04-14 NOTE — Progress Notes (Signed)
I,[name],acting as a Neurosurgeon for Adrienne Aliment, MD.,have documented all relevant documentation on the behalf of Adrienne Aliment, MD,as directed by  Adrienne Aliment, MD while in the presence of Adrienne Aliment, MD.  This visit occurred during the SARS-CoV-2 public health emergency.  Safety protocols were in place, including screening questions prior to the visit, additional usage of staff PPE, and extensive cleaning of exam room while observing appropriate contact time as indicated for disinfecting solutions.  Subjective:     Patient ID: Adrienne Benjamin , female    DOB: 12-22-1980 , 40 y.o.   MRN: [id number]   Chief Complaint  Patient presents with  . Hypertension    HPI  The patient is here today for a follow-up on HTN. She reports compliance with meds. She admits she is not yet exercising on a regular basis.   Hypertension This is a chronic problem. The current episode started more than 1 year ago. The problem has been gradually improving since onset. The problem is controlled. Pertinent negatives include no blurred vision, chest pain, palpitations or shortness of breath. Risk factors for coronary artery disease include obesity. The current treatment provides moderate improvement.     Past Medical History:  Diagnosis Date  . Abnormal Pap smear 3/11   [place]/ hpv  . Anemia   . Complex ovarian cyst   . Endometriosis 3/07  . Heavy menstrual bleeding   . Hypertension   . Insulin resistance    last few years  . Insulin resistance      Family History  Problem Relation Age of Onset  . Diabetes Father   . Hypertension Mother      Current Outpatient Medications:  .  Cholecalciferol (VITAMIN D) 50 MCG (2000 UT) CAPS, Take by mouth. daily, Disp: , Rfl:  .  folic acid (FOLVITE) 1 MG tablet, Take 1 mg by mouth daily., Disp: , Rfl:  .  ibuprofen (ADVIL) 600 MG tablet, Take 1 tablet (600 mg total) by mouth every 6 (six) hours as needed., Disp: 30 tablet, Rfl: 0 .  metFORMIN  (GLUCOPHAGE) 500 MG tablet, TAKE 1 TABLET BY MOUTH THREE TIMES DAILY WITH MEALS, Disp: 90 tablet, Rfl: 0 .  NIFEdipine (PROCARDIA-XL/NIFEDICAL-XL) 30 MG 24 hr tablet, Take 1 tablet by mouth once daily, Disp: 90 tablet, Rfl: 0 .  Prenatal Vit-Fe Fumarate-FA (PRENATAL MULTIVITAMIN) TABS, Take 1 tablet by mouth daily., Disp: , Rfl:  .  Semaglutide,0.25 or 0.5MG /DOS, (OZEMPIC, 0.25 OR 0.5 MG/DOSE,) 2 MG/1.5ML SOPN, INJECT 0.5mg  into skin weekly., Disp: 4.5 mL, Rfl: 2 .  hydrochlorothiazide (MICROZIDE) 12.5 MG capsule, Take 1 capsule by mouth once daily, Disp: 90 capsule, Rfl: 0 .  oxycodone-acetaminophen (PERCOCET) 2.5-325 MG tablet, Take 1 tablet by mouth every 4 (four) hours as needed for pain. (Patient not taking: Reported on 04/14/2020), Disp: 30 tablet, Rfl: 0   Allergies  Allergen Reactions  . Ciprofloxacin Nausea And Vomiting     Review of Systems  Constitutional: Negative.   Eyes: Negative for blurred vision.  Respiratory: Negative.  Negative for shortness of breath.   Cardiovascular: Negative.  Negative for chest pain and palpitations.  Gastrointestinal: Negative.   Psychiatric/Behavioral: Negative.   All other systems reviewed and are negative.    Today's Vitals   04/14/20 1053  BP: 112/76  Pulse: 79  Temp: 98.1 F (36.7 C)  TempSrc: Oral  Weight: (!) 308 lb 12.8 oz (140.1 kg)  Height: 6\' 2"  (1.88 m)   Body mass index  is 39.65 kg/m.  Wt Readings from Last 3 Encounters:  04/14/20 (!) 308 lb 12.8 oz (140.1 kg)  03/23/20 (!) 312 lb (141.5 kg)  11/21/19 (!) 318 lb 4.8 oz (144.4 kg)   Objective:  Physical Exam Vitals and nursing note reviewed.  Constitutional:      Appearance: Normal appearance. She is obese.  HENT:     Head: Normocephalic and atraumatic.     Nose:     Comments: Masked     Mouth/Throat:     Comments: Masked  Cardiovascular:     Rate and Rhythm: Normal rate and regular rhythm.     Heart sounds: Normal heart sounds.  Pulmonary:     Effort:  Pulmonary effort is normal.     Breath sounds: Normal breath sounds.  Musculoskeletal:     Cervical back: Normal range of motion.  Skin:    General: Skin is warm.  Neurological:     General: No focal deficit present.     Mental Status: She is alert.  Psychiatric:        Mood and Affect: Mood normal.        Behavior: Behavior normal.         Assessment And Plan:     1. Essential hypertension, benign Comments: Well controlled. She will c/w nifedipine. Advised to follow low sodium diet.  I will check renal function today.  - COMPLETE METABOLIC PANEL WITH GFR  2. Prediabetes Comments: Her a1c has been elevated in the past. I will recheck an a1c today. Advised to avoid sweetened beverages, including diet drinks.  - Hemoglobin A1c  3. Iron deficiency anemia due to chronic blood loss Comments: I will check CBC and iron studies as well.  - CBC no Diff - Ferritin - Iron and IBC ([id number])  4. Class 2 severe obesity due to excess calories with serious comorbidity and body mass index (BMI) of 39.0 to 39.9 in adult [facility]) Comments: She has lost ten pounds since Nov 2021. Encouraged to aim for at least 150 minutes of exercise per week.   Patient was given opportunity to ask questions. Patient verbalized understanding of the plan and was able to repeat key elements of the plan. All questions were answered to their satisfaction.   I, Adrienne Aliment, MD, have reviewed all documentation for this visit. The documentation on 04/25/20 for the exam, diagnosis, procedures, and orders are all accurate and complete.   IF YOU HAVE BEEN REFERRED TO A SPECIALIST, IT MAY TAKE 1-2 WEEKS TO SCHEDULE/PROCESS THE REFERRAL. IF YOU HAVE NOT HEARD FROM US/SPECIALIST IN TWO WEEKS, PLEASE GIVE [place] A CALL AT [phone] X [phone].   THE PATIENT IS ENCOURAGED TO PRACTICE SOCIAL DISTANCING DUE TO THE COVID-19 PANDEMIC.

## 2020-04-14 NOTE — Patient Instructions (Signed)

## 2020-04-19 ENCOUNTER — Encounter: Payer: Self-pay | Admitting: Internal Medicine

## 2020-04-20 ENCOUNTER — Other Ambulatory Visit: Payer: Self-pay | Admitting: Internal Medicine

## 2020-04-25 ENCOUNTER — Encounter: Payer: Self-pay | Admitting: Internal Medicine

## 2020-05-06 ENCOUNTER — Other Ambulatory Visit: Payer: Self-pay | Admitting: Internal Medicine

## 2020-06-06 ENCOUNTER — Other Ambulatory Visit: Payer: Self-pay | Admitting: Internal Medicine

## 2020-06-06 DIAGNOSIS — I10 Essential (primary) hypertension: Secondary | ICD-10-CM

## 2020-07-07 ENCOUNTER — Other Ambulatory Visit: Payer: Self-pay | Admitting: Internal Medicine

## 2020-08-01 ENCOUNTER — Other Ambulatory Visit: Payer: Self-pay | Admitting: Internal Medicine

## 2020-08-07 ENCOUNTER — Other Ambulatory Visit: Payer: Self-pay | Admitting: Internal Medicine

## 2020-08-09 ENCOUNTER — Encounter: Payer: Self-pay | Admitting: Internal Medicine

## 2020-08-26 ENCOUNTER — Encounter: Payer: Self-pay | Admitting: Internal Medicine

## 2020-09-06 ENCOUNTER — Other Ambulatory Visit: Payer: Self-pay | Admitting: Internal Medicine

## 2020-09-06 ENCOUNTER — Encounter: Payer: Self-pay | Admitting: Internal Medicine

## 2020-09-06 DIAGNOSIS — I10 Essential (primary) hypertension: Secondary | ICD-10-CM

## 2020-09-15 ENCOUNTER — Other Ambulatory Visit: Payer: Self-pay | Admitting: Internal Medicine

## 2020-10-17 ENCOUNTER — Other Ambulatory Visit: Payer: Self-pay | Admitting: Internal Medicine

## 2020-10-21 ENCOUNTER — Other Ambulatory Visit: Payer: Self-pay

## 2020-10-21 ENCOUNTER — Encounter: Payer: Self-pay | Admitting: Internal Medicine

## 2020-10-21 ENCOUNTER — Ambulatory Visit (INDEPENDENT_AMBULATORY_CARE_PROVIDER_SITE_OTHER): Payer: BC Managed Care – PPO | Admitting: Internal Medicine

## 2020-10-21 VITALS — BP 116/78 | HR 80 | Temp 98.4°F | Ht 73.6 in | Wt 302.0 lb

## 2020-10-21 DIAGNOSIS — Z6839 Body mass index (BMI) 39.0-39.9, adult: Secondary | ICD-10-CM

## 2020-10-21 DIAGNOSIS — N390 Urinary tract infection, site not specified: Secondary | ICD-10-CM | POA: Diagnosis not present

## 2020-10-21 DIAGNOSIS — Z0000 Encounter for general adult medical examination without abnormal findings: Secondary | ICD-10-CM | POA: Diagnosis not present

## 2020-10-21 DIAGNOSIS — E8881 Metabolic syndrome: Secondary | ICD-10-CM

## 2020-10-21 DIAGNOSIS — I10 Essential (primary) hypertension: Secondary | ICD-10-CM

## 2020-10-21 DIAGNOSIS — E88819 Insulin resistance, unspecified: Secondary | ICD-10-CM

## 2020-10-21 LAB — POCT URINALYSIS DIPSTICK
Bilirubin, UA: NEGATIVE
Blood, UA: NEGATIVE
Glucose, UA: NEGATIVE
Ketones, UA: NEGATIVE
Nitrite, UA: NEGATIVE
Protein, UA: NEGATIVE
Spec Grav, UA: 1.025 (ref 1.010–1.025)
Urobilinogen, UA: 0.2 E.U./dL
pH, UA: 7 (ref 5.0–8.0)

## 2020-10-21 LAB — POCT UA - MICROALBUMIN
Albumin/Creatinine Ratio, Urine, POC: <30
Creatinine, POC: 200 mg/dL
Microalbumin Ur, POC: 10 mg/L

## 2020-10-21 MED ORDER — METFORMIN HCL 500 MG PO TABS: 500.0000 mg | ORAL_TABLET | Freq: Three times a day (TID) | ORAL | 2 refills | Status: DC

## 2020-10-21 MED ORDER — HYDROCHLOROTHIAZIDE 12.5 MG PO CAPS: 12.5000 mg | ORAL_CAPSULE | Freq: Every day | ORAL | 2 refills | Status: DC

## 2020-10-21 MED ORDER — NIFEDIPINE ER OSMOTIC RELEASE 30 MG PO TB24: 30.0000 mg | ORAL_TABLET | Freq: Every day | ORAL | 2 refills | Status: DC

## 2020-10-21 NOTE — Patient Instructions (Signed)
Health Maintenance, Female Adopting a healthy lifestyle and getting preventive care are important in promoting health and wellness. Ask your health care provider about: The right schedule for you to have regular tests and exams. Things you can do on your own to prevent diseases and keep yourself healthy. What should I know about diet, weight, and exercise? Eat a healthy diet  Eat a diet that includes plenty of vegetables, fruits, low-fat dairy products, and lean protein. Do not eat a lot of foods that are high in solid fats, added sugars, or sodium. Maintain a healthy weight Body mass index (BMI) is used to identify weight problems. It estimates body fat based on height and weight. Your health care provider can help determine your BMI and help you achieve or maintain a healthy weight. Get regular exercise Get regular exercise. This is one of the most important things you can do for your health. Most adults should: Exercise for at least 150 minutes each week. The exercise should increase your heart rate and make you sweat (moderate-intensity exercise). Do strengthening exercises at least twice a week. This is in addition to the moderate-intensity exercise. Spend less time sitting. Even light physical activity can be beneficial. Watch cholesterol and blood lipids Have your blood tested for lipids and cholesterol at 39 years of age, then have this test every 5 years. Have your cholesterol levels checked more often if: Your lipid or cholesterol levels are high. You are older than 40 years of age. You are at high risk for heart disease. What should I know about cancer screening? Depending on your health history and family history, you may need to have cancer screening at various ages. This may include screening for: Breast cancer. Cervical cancer. Colorectal cancer. Skin cancer. Lung cancer. What should I know about heart disease, diabetes, and high blood pressure? Blood pressure and heart  disease High blood pressure causes heart disease and increases the risk of stroke. This is more likely to develop in people who have high blood pressure readings, are of African descent, or are overweight. Have your blood pressure checked: Every 3-5 years if you are 18-39 years of age. Every year if you are 40 years old or older. Diabetes Have regular diabetes screenings. This checks your fasting blood sugar level. Have the screening done: Once every three years after age 40 if you are at a normal weight and have a low risk for diabetes. More often and at a younger age if you are overweight or have a high risk for diabetes. What should I know about preventing infection? Hepatitis B If you have a higher risk for hepatitis B, you should be screened for this virus. Talk with your health care provider to find out if you are at risk for hepatitis B infection. Hepatitis C Testing is recommended for: Everyone born from 1945 through 1965. Anyone with known risk factors for hepatitis C. Sexually transmitted infections (STIs) Get screened for STIs, including gonorrhea and chlamydia, if: You are sexually active and are younger than 39 years of age. You are older than 39 years of age and your health care provider tells you that you are at risk for this type of infection. Your sexual activity has changed since you were last screened, and you are at increased risk for chlamydia or gonorrhea. Ask your health care provider if you are at risk. Ask your health care provider about whether you are at high risk for HIV. Your health care provider may recommend a prescription medicine   to help prevent HIV infection. If you choose to take medicine to prevent HIV, you should first get tested for HIV. You should then be tested every 3 months for as long as you are taking the medicine. Pregnancy If you are about to stop having your period (premenopausal) and you may become pregnant, seek counseling before you get  pregnant. Take 400 to 800 micrograms (mcg) of folic acid every day if you become pregnant. Ask for birth control (contraception) if you want to prevent pregnancy. Osteoporosis and menopause Osteoporosis is a disease in which the bones lose minerals and strength with aging. This can result in bone fractures. If you are 65 years old or older, or if you are at risk for osteoporosis and fractures, ask your health care provider if you should: Be screened for bone loss. Take a calcium or vitamin D supplement to lower your risk of fractures. Be given hormone replacement therapy (HRT) to treat symptoms of menopause. Follow these instructions at home: Lifestyle Do not use any products that contain nicotine or tobacco, such as cigarettes, e-cigarettes, and chewing tobacco. If you need help quitting, ask your health care provider. Do not use street drugs. Do not share needles. Ask your health care provider for help if you need support or information about quitting drugs. Alcohol use Do not drink alcohol if: Your health care provider tells you not to drink. You are pregnant, may be pregnant, or are planning to become pregnant. If you drink alcohol: Limit how much you use to 0-1 drink a day. Limit intake if you are breastfeeding. Be aware of how much alcohol is in your drink. In the [place] one drink equals one 12 oz bottle of beer (355 mL), one 5 oz glass of wine (148 mL), or one 1 oz glass of hard liquor (44 mL). General instructions Schedule regular health, dental, and eye exams. Stay current with your vaccines. Tell your health care provider if: You often feel depressed. You have ever been abused or do not feel safe at home. Summary Adopting a healthy lifestyle and getting preventive care are important in promoting health and wellness. Follow your health care provider's instructions about healthy diet, exercising, and getting tested or screened for diseases. Follow your health care provider's  instructions on monitoring your cholesterol and blood pressure. This information is not intended to replace advice given to you by your health care provider. Make sure you discuss any questions you have with your health care provider. Document Revised: 02/27/2020 Document Reviewed: 12/12/2017 Elsevier Patient Education  2022 [employer]  

## 2020-10-21 NOTE — Progress Notes (Signed)
I,[name],acting as a Education administrator for [name], MD.,have documented all relevant documentation on the behalf of [name], MD,as directed by  [name], MD while in the presence of [name], MD.  This visit occurred during the SARS-CoV-2 public health emergency.  Safety protocols were in place, including screening questions prior to the visit, additional usage of staff PPE, and extensive cleaning of exam room while observing appropriate contact time as indicated for disinfecting solutions.  Subjective:     Patient ID: Adrienne Benjamin , female    DOB: 21-Jul-1980 , 40 y.o.   MRN: [id number]   Chief Complaint  Patient presents with   Annual Exam   Hypertension    HPI  She is here today for a full physical examination. She has her pap smears performed by Dr. Charlesetta Garibaldi. Las pap was October 2021. She is still trying to conceive. She has yet to see a fertility specialist.   Hypertension This is a chronic problem. The current episode started more than 1 year ago. The problem has been gradually improving since onset. The problem is controlled. Pertinent negatives include no blurred vision, chest pain or palpitations. Risk factors for coronary artery disease include obesity and sedentary lifestyle. Past treatments include calcium channel blockers and diuretics. The current treatment provides moderate improvement. Compliance problems include exercise.     Past Medical History:  Diagnosis Date   Abnormal Pap smear 3/11   ascus/ hpv   Anemia    Complex ovarian cyst    Endometriosis 3/07   Heavy menstrual bleeding    Hypertension    Insulin resistance    last few years   Insulin resistance      Family History  Problem Relation Age of Onset   Diabetes Father    Hypertension Mother      Current Outpatient Medications:    Cholecalciferol (VITAMIN D) 50 MCG (2000 UT) CAPS, Take by mouth. daily, Disp: , Rfl:    folic acid (FOLVITE) 1 MG tablet, Take 1 mg by mouth  daily., Disp: , Rfl:    ibuprofen (ADVIL) 600 MG tablet, Take 1 tablet (600 mg total) by mouth every 6 (six) hours as needed., Disp: 30 tablet, Rfl: 0   Prenatal Vit-Fe Fumarate-FA (PRENATAL MULTIVITAMIN) TABS, Take 1 tablet by mouth daily., Disp: , Rfl:    Semaglutide,0.25 or 0.5MG /DOS, (OZEMPIC, 0.25 OR 0.5 MG/DOSE,) 2 MG/1.5ML SOPN, INJECT 0.5mg  into skin weekly., Disp: 4.5 mL, Rfl: 2   hydrochlorothiazide (MICROZIDE) 12.5 MG capsule, Take 1 capsule (12.5 mg total) by mouth daily., Disp: 90 capsule, Rfl: 2   metFORMIN (GLUCOPHAGE) 500 MG tablet, Take 1 tablet (500 mg total) by mouth 3 (three) times daily with meals., Disp: 270 tablet, Rfl: 2   NIFEdipine (PROCARDIA-XL/NIFEDICAL-XL) 30 MG 24 hr tablet, Take 1 tablet (30 mg total) by mouth daily., Disp: 90 tablet, Rfl: 2   oxycodone-acetaminophen (PERCOCET) 2.5-325 MG tablet, Take 1 tablet by mouth every 4 (four) hours as needed for pain. (Patient not taking: No sig reported), Disp: 30 tablet, Rfl: 0   Allergies  Allergen Reactions   Ciprofloxacin Nausea And Vomiting      The patient states she uses none for birth control. Last LMP was Patient's last menstrual period was 10/14/2020..   . Negative for: breast discharge, breast lump(s), breast pain and breast self exam. Associated symptoms include abnormal vaginal bleeding. Pertinent negatives include abnormal bleeding (hematology), anxiety, decreased libido, depression, difficulty falling sleep, dyspareunia, history of infertility, nocturia,  sexual dysfunction, sleep disturbances, urinary incontinence, urinary urgency, vaginal discharge and vaginal itching. Diet regular.The patient states her exercise level is  intermittent.  . The patient's tobacco use is:  Social History   Tobacco Use  Smoking Status Never  Smokeless Tobacco Never  . She has been exposed to passive smoke. The patient's alcohol use is:  Social History   Substance and Sexual Activity  Alcohol Use No   Review of Systems   Constitutional: Negative.   HENT: Negative.    Eyes: Negative.  Negative for blurred vision.  Respiratory: Negative.    Cardiovascular: Negative.  Negative for chest pain and palpitations.  Gastrointestinal: Negative.   Endocrine: Negative.   Genitourinary: Negative.   Musculoskeletal: Negative.   Skin: Negative.   Allergic/Immunologic: Negative.   Neurological: Negative.   Hematological: Negative.   Psychiatric/Behavioral: Negative.      Today's Vitals   10/21/20 1051  BP: 116/78  Pulse: 80  Temp: 98.4 F (36.9 C)  Weight: (!) 302 lb (137 kg)  Height: 6' 1.6" (1.869 m)   Body mass index is 39.2 kg/m.  Wt Readings from Last 3 Encounters:  10/21/20 (!) 302 lb (137 kg)  04/14/20 (!) 308 lb 12.8 oz (140.1 kg)  03/23/20 (!) 312 lb (141.5 kg)    BP Readings from Last 3 Encounters:  10/21/20 116/78  04/14/20 112/76  03/23/20 138/84    Objective:  Physical Exam Vitals and nursing note reviewed.  Constitutional:      General: She is not in acute distress.    Appearance: Normal appearance. She is well-developed. She is obese.  HENT:     Head: Normocephalic and atraumatic.     Right Ear: Hearing, tympanic membrane, ear canal and external ear normal. There is no impacted cerumen.     Left Ear: Hearing, tympanic membrane, ear canal and external ear normal. There is no impacted cerumen.     Nose:     Comments: Deferred - masked    Mouth/Throat:     Comments: Deferred - masked Eyes:     General: Lids are normal.     Extraocular Movements: Extraocular movements intact.     Conjunctiva/sclera: Conjunctivae normal.     Pupils: Pupils are equal, round, and reactive to light.     Funduscopic exam:    Right eye: No papilledema.        Left eye: No papilledema.  Neck:     Thyroid: No thyroid mass.     Vascular: No carotid bruit.  Cardiovascular:     Rate and Rhythm: Normal rate and regular rhythm.     Pulses: Normal pulses.     Heart sounds: Normal heart sounds. No murmur  heard. Pulmonary:     Effort: Pulmonary effort is normal.     Breath sounds: Normal breath sounds.  Chest:     Chest wall: No mass.  Breasts:    Tanner Score is 5.     Right: Normal. No mass or tenderness.     Left: Normal. No mass or tenderness.  Abdominal:     General: Bowel sounds are normal. There is no distension.     Palpations: Abdomen is soft.     Tenderness: There is no abdominal tenderness.     Comments: Obese, soft.   Musculoskeletal:        General: No swelling. Normal range of motion.     Cervical back: Full passive range of motion without pain, normal range of motion and neck supple.  Right lower leg: No edema.     Left lower leg: No edema.  Lymphadenopathy:     Upper Body:     Right upper body: No supraclavicular, axillary or pectoral adenopathy.     Left upper body: No supraclavicular, axillary or pectoral adenopathy.  Skin:    General: Skin is warm and dry.     Capillary Refill: Capillary refill takes less than 2 seconds.  Neurological:     General: No focal deficit present.     Mental Status: She is alert and oriented to person, place, and time.     Cranial Nerves: No cranial nerve deficit.     Sensory: No sensory deficit.  Psychiatric:        Mood and Affect: Mood normal.        Behavior: Behavior normal.        Thought Content: Thought content normal.        Judgment: Judgment normal.        Assessment And Plan:     1. Routine general medical examination at health care facility Comments: A full exam was performed. Importance of monthly self breast exams was discussed with the patient. She will continue her GYN care w/ Dr. Charlesetta Garibaldi. PATIENT IS ADVISED TO GET 30-45 MINUTES REGULAR EXERCISE NO LESS THAN FOUR TO FIVE DAYS PER WEEK - BOTH WEIGHTBEARING EXERCISES AND AEROBIC ARE RECOMMENDED.  PATIENT IS ADVISED TO FOLLOW A HEALTHY DIET WITH AT LEAST SIX FRUITS/VEGGIES PER DAY, DECREASE INTAKE OF RED MEAT, AND TO INCREASE FISH INTAKE TO TWO DAYS PER WEEK.   MEATS/FISH SHOULD NOT BE FRIED, BAKED OR BROILED IS PREFERABLE.  IT IS ALSO IMPORTANT TO CUT BACK ON YOUR SUGAR INTAKE. PLEASE AVOID ANYTHING WITH ADDED SUGAR, CORN SYRUP OR OTHER SWEETENERS. IF YOU MUST USE A SWEETENER, YOU CAN TRY STEVIA. IT IS ALSO IMPORTANT TO AVOID ARTIFICIALLY SWEETENERS AND DIET BEVERAGES. LASTLY, I SUGGEST WEARING SPF 50 SUNSCREEN ON EXPOSED PARTS AND ESPECIALLY WHEN IN THE DIRECT SUNLIGHT FOR AN EXTENDED PERIOD OF TIME.  PLEASE AVOID FAST FOOD RESTAURANTS AND INCREASE YOUR WATER INTAKE.  - Hemoglobin A1c - CBC - CMP14+EGFR - Lipid panel - Insulin, random(561) - TSH  2. Essential hypertension, benign Comments: Chronic, well controlled. Encouraged to follow low sodium diet. EKG performed, NSR w/o acute changes. She will f/u in six months. No med changes.  - EKG 12-Lead - NIFEdipine (PROCARDIA-XL/NIFEDICAL-XL) 30 MG 24 hr tablet; Take 1 tablet (30 mg total) by mouth daily.  Dispense: 90 tablet; Refill: 2  3. Insulin resistance Comments: Chronic, she will continue with Semaglutide.  Encouraged to limit her intake of sweetened beverages and refined carbs. She will f/u in 3-4 months.  - POCT Urinalysis Dipstick (81002) - POCT UA - Microalbumin  4. Class 2 severe obesity due to excess calories with serious comorbidity and body mass index (BMI) of 39.0 to 39.9 in adult [facility]) Comments: She has lost 33 pounds since May 2021. She is encouraged to strive for BMI < 30 to decrease cardiac risk. Advised to aim for at least 150 minutes of exercise/wk.  Patient was given opportunity to ask questions. Patient verbalized understanding of the plan and was able to repeat key elements of the plan. All questions were answered to their satisfaction.   I, [name], MD, have reviewed all documentation for this visit. The documentation on 10/21/20 for the exam, diagnosis, procedures, and orders are all accurate and complete.   THE PATIENT IS ENCOURAGED TO PRACTICE SOCIAL DISTANCING  DUE TO THE COVID-19 PANDEMIC.

## 2020-10-22 LAB — CBC
Hematocrit: 32.6 % — ABNORMAL LOW (ref 34.0–46.6)
Hemoglobin: 10.4 g/dL — ABNORMAL LOW (ref 11.1–15.9)
MCH: 23 pg — ABNORMAL LOW (ref 26.6–33.0)
MCHC: 31.9 g/dL (ref 31.5–35.7)
MCV: 72 fL — ABNORMAL LOW (ref 79–97)
Platelets: 364 10*3/uL (ref 150–450)
RBC: 4.53 x10E6/uL (ref 3.77–5.28)
RDW: 16.6 % — ABNORMAL HIGH (ref 11.7–15.4)
WBC: 4.3 10*3/uL (ref 3.4–10.8)

## 2020-10-22 LAB — HEMOGLOBIN A1C
Est. average glucose Bld gHb Est-mCnc: 108 mg/dL
Hgb A1c MFr Bld: 5.4 % (ref 4.8–5.6)

## 2020-10-22 LAB — CMP14+EGFR
ALT: 7 IU/L (ref 0–32)
AST: 13 IU/L (ref 0–40)
Albumin/Globulin Ratio: 1.5 (ref 1.2–2.2)
Albumin: 4.1 g/dL (ref 3.8–4.8)
Alkaline Phosphatase: 84 IU/L (ref 44–121)
BUN/Creatinine Ratio: 12 (ref 9–23)
BUN: 7 mg/dL (ref 6–20)
Bilirubin Total: 0.3 mg/dL (ref 0.0–1.2)
CO2: 25 mmol/L (ref 20–29)
Calcium: 9.3 mg/dL (ref 8.7–10.2)
Chloride: 102 mmol/L (ref 96–106)
Creatinine, Ser: 0.57 mg/dL (ref 0.57–1.00)
Globulin, Total: 2.7 g/dL (ref 1.5–4.5)
Glucose: 83 mg/dL (ref 70–99)
Potassium: 4 mmol/L (ref 3.5–5.2)
Sodium: 138 mmol/L (ref 134–144)
Total Protein: 6.8 g/dL (ref 6.0–8.5)
eGFR: 118 mL/min/{1.73_m2} (ref >59)

## 2020-10-22 LAB — LIPID PANEL
Chol/HDL Ratio: 2.2 ratio (ref 0.0–4.4)
Cholesterol, Total: 169 mg/dL (ref 100–199)
HDL: 77 mg/dL (ref >39)
LDL Chol Calc (NIH): 83 mg/dL (ref 0–99)
Triglycerides: 43 mg/dL (ref 0–149)
VLDL Cholesterol Cal: 9 mg/dL (ref 5–40)

## 2020-10-22 LAB — INSULIN, RANDOM: INSULIN: 20.3 u[IU]/mL (ref 2.6–24.9)

## 2020-10-22 LAB — TSH: TSH: 1.76 u[IU]/mL (ref 0.450–4.500)

## 2020-10-23 ENCOUNTER — Encounter: Payer: Self-pay | Admitting: Internal Medicine

## 2020-10-23 DIAGNOSIS — E8881 Metabolic syndrome: Secondary | ICD-10-CM | POA: Insufficient documentation

## 2020-10-23 DIAGNOSIS — E66813 Obesity, class 3: Secondary | ICD-10-CM | POA: Insufficient documentation

## 2020-10-24 LAB — URINE CULTURE

## 2020-11-01 ENCOUNTER — Encounter: Payer: Self-pay | Admitting: Internal Medicine

## 2020-11-01 DIAGNOSIS — N979 Female infertility, unspecified: Secondary | ICD-10-CM | POA: Diagnosis not present

## 2020-11-01 DIAGNOSIS — Z124 Encounter for screening for malignant neoplasm of cervix: Secondary | ICD-10-CM | POA: Diagnosis not present

## 2020-11-01 DIAGNOSIS — Z01419 Encounter for gynecological examination (general) (routine) without abnormal findings: Secondary | ICD-10-CM | POA: Diagnosis not present

## 2020-11-01 DIAGNOSIS — Z3040 Encounter for surveillance of contraceptives, unspecified: Secondary | ICD-10-CM | POA: Diagnosis not present

## 2020-11-20 LAB — HM PAP SMEAR: HM Pap smear: POSITIVE

## 2020-12-13 ENCOUNTER — Other Ambulatory Visit: Payer: Self-pay | Admitting: Obstetrics and Gynecology

## 2020-12-13 DIAGNOSIS — R87610 Atypical squamous cells of undetermined significance on cytologic smear of cervix (ASC-US): Secondary | ICD-10-CM | POA: Diagnosis not present

## 2020-12-14 DIAGNOSIS — N979 Female infertility, unspecified: Secondary | ICD-10-CM | POA: Diagnosis not present

## 2020-12-14 DIAGNOSIS — R87810 Cervical high risk human papillomavirus (HPV) DNA test positive: Secondary | ICD-10-CM | POA: Diagnosis not present

## 2020-12-14 DIAGNOSIS — R87610 Atypical squamous cells of undetermined significance on cytologic smear of cervix (ASC-US): Secondary | ICD-10-CM | POA: Diagnosis not present

## 2021-01-03 ENCOUNTER — Encounter: Payer: Self-pay | Admitting: Internal Medicine

## 2021-01-10 ENCOUNTER — Other Ambulatory Visit: Payer: Self-pay

## 2021-01-10 MED ORDER — OZEMPIC (1 MG/DOSE) 4 MG/3ML ~~LOC~~ SOPN: 1.0000 mg | PEN_INJECTOR | SUBCUTANEOUS | 2 refills | Status: DC

## 2021-01-25 ENCOUNTER — Other Ambulatory Visit: Payer: Self-pay

## 2021-01-25 ENCOUNTER — Encounter: Payer: Self-pay | Admitting: Internal Medicine

## 2021-01-25 ENCOUNTER — Ambulatory Visit (INDEPENDENT_AMBULATORY_CARE_PROVIDER_SITE_OTHER): Payer: BC Managed Care – PPO | Admitting: Internal Medicine

## 2021-01-25 VITALS — BP 124/80 | HR 78 | Temp 99.1°F | Ht 72.0 in | Wt 290.8 lb

## 2021-01-25 DIAGNOSIS — N979 Female infertility, unspecified: Secondary | ICD-10-CM

## 2021-01-25 DIAGNOSIS — E8881 Metabolic syndrome: Secondary | ICD-10-CM

## 2021-01-25 DIAGNOSIS — Z6839 Body mass index (BMI) 39.0-39.9, adult: Secondary | ICD-10-CM | POA: Diagnosis not present

## 2021-01-25 NOTE — Progress Notes (Signed)
[name],acting as a Neurosurgeon for [name], MD.,have documented all relevant documentation on the behalf of [name], MD,as directed by  [name], MD while in the presence of [name], MD.  This visit occurred during the SARS-CoV-2 public health emergency.  Safety protocols were in place, including screening questions prior to the visit, additional usage of staff PPE, and extensive cleaning of exam room while observing appropriate contact time as indicated for disinfecting solutions.  Subjective:     Patient ID: Adrienne Benjamin , female    DOB: September 20, 1980 , 41 y.o.   MRN: [id number]   Chief Complaint  Patient presents with   Prediabetes    HPI  Patient presents today for a prediabetes f/u. She reports compliance with her medications. She denies headaches,chest pain and shortness of breath. She has not had any issues with use of weekly semaglutide. She states she is exercising twice weekly.      Past Medical History:  Diagnosis Date   Abnormal Pap smear 3/11   ascus/ hpv   Anemia    Complex ovarian cyst    Endometriosis 3/07   Heavy menstrual bleeding    Hypertension    Insulin resistance    last few years   Insulin resistance      Family History  Problem Relation Age of Onset   Diabetes Father    Hypertension Mother      Current Outpatient Medications:    Cholecalciferol (VITAMIN D) 50 MCG (2000 UT) CAPS, Take by mouth. daily, Disp: , Rfl:    folic acid (FOLVITE) 1 MG tablet, Take 1 mg by mouth daily., Disp: , Rfl:    hydrochlorothiazide (MICROZIDE) 12.5 MG capsule, Take 1 capsule (12.5 mg total) by mouth daily., Disp: 90 capsule, Rfl: 2   ibuprofen (ADVIL) 600 MG tablet, Take 1 tablet (600 mg total) by mouth every 6 (six) hours as needed., Disp: 30 tablet, Rfl: 0   metFORMIN (GLUCOPHAGE) 500 MG tablet, Take 1 tablet (500 mg total) by mouth 3 (three) times daily with meals., Disp: 270 tablet, Rfl: 2   NIFEdipine  (PROCARDIA-XL/NIFEDICAL-XL) 30 MG 24 hr tablet, Take 1 tablet (30 mg total) by mouth daily., Disp: 90 tablet, Rfl: 2   Prenatal Vit-Fe Fumarate-FA (PRENATAL MULTIVITAMIN) TABS, Take 1 tablet by mouth daily., Disp: , Rfl:    Semaglutide, 1 MG/DOSE, (OZEMPIC, 1 MG/DOSE,) 4 MG/3ML SOPN, Inject 1 mg into the skin once a week., Disp: 3 mL, Rfl: 2   Allergies  Allergen Reactions   Ciprofloxacin Nausea And Vomiting     Review of Systems  Constitutional: Negative.   Respiratory: Negative.    Cardiovascular: Negative.   Gastrointestinal: Negative.   Neurological: Negative.   Psychiatric/Behavioral: Negative.      Today's Vitals   01/25/21 1152  BP: 124/80  Pulse: 78  Temp: 99.1 F (37.3 C)  Weight: 290 lb 12.8 oz (131.9 kg)  Height: 6' (1.829 m)  PainSc: 0-No pain   Body mass index is 39.44 kg/m.  Wt Readings from Last 3 Encounters:  01/25/21 290 lb 12.8 oz (131.9 kg)  10/21/20 (!) 302 lb (137 kg)  04/14/20 (!) 308 lb 12.8 oz (140.1 kg)    Objective:  Physical Exam Vitals and nursing note reviewed.  Constitutional:      Appearance: Normal appearance. She is obese.  HENT:     Head: Normocephalic and atraumatic.     Nose:     Comments: Masked  Mouth/Throat:     Comments: Masked  Eyes:     Extraocular Movements: Extraocular movements intact.  Cardiovascular:     Rate and Rhythm: Normal rate and regular rhythm.     Heart sounds: Normal heart sounds.  Pulmonary:     Effort: Pulmonary effort is normal.     Breath sounds: Normal breath sounds.  Musculoskeletal:     Cervical back: Normal range of motion.  Skin:    General: Skin is warm.  Neurological:     General: No focal deficit present.     Mental Status: She is alert.  Psychiatric:        Mood and Affect: Mood normal.        Behavior: Behavior normal.        Assessment And Plan:     1. Insulin resistance Comments: Chronic, she will c/w Semaglutide 1mg  weekly.  2. Infertility, female Comments: She has been  trying to get pregnant for over a year. She is also followed by GYN, Dr. . She agrees to referral to fertility specialist.   - Ambulatory referral to Gynecology  3. Class 2 severe obesity due to excess calories with serious comorbidity and body mass index (BMI) of 39.0 to 39.9 in adult [facility]) Comments: She was congratulated on her 12 lbs weight loss since Oct 2022. She is encouraged to keep up the great work. She will f/u in 10 wks for re-evaluation.    Patient was given opportunity to ask questions. Patient verbalized understanding of the plan and was able to repeat key elements of the plan. All questions were answered to their satisfaction.   I, Nov 2022, MD, have reviewed all documentation for this visit. The documentation on 01/25/21 for the exam, diagnosis, procedures, and orders are all accurate and complete.   IF YOU HAVE BEEN REFERRED TO A SPECIALIST, IT MAY TAKE 1-2 WEEKS TO SCHEDULE/PROCESS THE REFERRAL. IF YOU HAVE NOT HEARD FROM US/SPECIALIST IN TWO WEEKS, PLEASE GIVE 01/27/21 A CALL AT [phone].   THE PATIENT IS ENCOURAGED TO PRACTICE SOCIAL DISTANCING DUE TO THE COVID-19 PANDEMIC.

## 2021-01-25 NOTE — Patient Instructions (Signed)

## 2021-01-31 DIAGNOSIS — R87610 Atypical squamous cells of undetermined significance on cytologic smear of cervix (ASC-US): Secondary | ICD-10-CM | POA: Diagnosis not present

## 2021-01-31 DIAGNOSIS — N979 Female infertility, unspecified: Secondary | ICD-10-CM | POA: Diagnosis not present

## 2021-02-24 DIAGNOSIS — N979 Female infertility, unspecified: Secondary | ICD-10-CM | POA: Diagnosis not present

## 2021-03-10 DIAGNOSIS — Z01419 Encounter for gynecological examination (general) (routine) without abnormal findings: Secondary | ICD-10-CM | POA: Diagnosis not present

## 2021-03-10 DIAGNOSIS — N979 Female infertility, unspecified: Secondary | ICD-10-CM | POA: Diagnosis not present

## 2021-03-21 DIAGNOSIS — Z3141 Encounter for fertility testing: Secondary | ICD-10-CM | POA: Diagnosis not present

## 2021-03-21 DIAGNOSIS — N979 Female infertility, unspecified: Secondary | ICD-10-CM | POA: Diagnosis not present

## 2021-04-12 ENCOUNTER — Ambulatory Visit: Payer: BC Managed Care – PPO | Admitting: Internal Medicine

## 2021-04-12 ENCOUNTER — Encounter: Payer: Self-pay | Admitting: Internal Medicine

## 2021-04-12 VITALS — BP 130/76 | HR 85 | Temp 98.7°F | Ht 72.0 in | Wt 276.0 lb

## 2021-04-12 DIAGNOSIS — I10 Essential (primary) hypertension: Secondary | ICD-10-CM

## 2021-04-12 DIAGNOSIS — E8881 Metabolic syndrome: Secondary | ICD-10-CM | POA: Diagnosis not present

## 2021-04-12 DIAGNOSIS — D500 Iron deficiency anemia secondary to blood loss (chronic): Secondary | ICD-10-CM | POA: Diagnosis not present

## 2021-04-12 DIAGNOSIS — Z6837 Body mass index (BMI) 37.0-37.9, adult: Secondary | ICD-10-CM

## 2021-04-12 MED ORDER — OZEMPIC (1 MG/DOSE) 4 MG/3ML ~~LOC~~ SOPN: 1.0000 mg | PEN_INJECTOR | SUBCUTANEOUS | 2 refills | Status: DC

## 2021-04-12 NOTE — Progress Notes (Signed)
?Industrial/product designer as a Education administrator for [name], MD.,have documented all relevant documentation on the behalf of [name], MD,as directed by  [name], MD while in the presence of [name], MD. ? ?This visit occurred during the SARS-CoV-2 public health emergency.  Safety protocols were in place, including screening questions prior to the visit, additional usage of staff PPE, and extensive cleaning of exam room while observing appropriate contact time as indicated for disinfecting solutions. ? ?Subjective:  ?  ? Patient ID: Adrienne Benjamin , female    DOB: 02-28-1980 , 41 y.o.   MRN: [id number] ? ? ?Chief Complaint  ?Patient presents with  ? Prediabetes  ? ? ?HPI ? ?Patient presents today for HTN and insulin resistance. She reports compliance with meds. She is tolerating weekly semaglutide without any issues.  ? ?Hypertension ?This is a chronic problem. The current episode started more than 1 year ago. The problem has been gradually improving since onset. The problem is controlled. Pertinent negatives include no blurred vision, chest pain, palpitations or shortness of breath. Risk factors for coronary artery disease include obesity. The current treatment provides moderate improvement.   ? ?Past Medical History:  ?Diagnosis Date  ? Abnormal Pap smear 3/11  ? ascus/ hpv  ? Anemia   ? Complex ovarian cyst   ? Endometriosis 3/07  ? Heavy menstrual bleeding   ? Hypertension   ? Insulin resistance   ? last few years  ? Insulin resistance   ?  ? ?Family History  ?Problem Relation Age of Onset  ? Diabetes Father   ? Hypertension Mother   ? ? ? ?Current Outpatient Medications:  ?  Ascorbic Acid (VITAMIN C) 100 MG tablet, Take 100 mg by mouth daily., Disp: , Rfl:  ?  Cholecalciferol (VITAMIN D) 50 MCG (2000 UT) CAPS, Take by mouth. daily, Disp: , Rfl:  ?  co-enzyme Q-10 30 MG capsule, Take 30 mg by mouth 3 (three) times daily., Disp: , Rfl:  ?  folic acid (FOLVITE) 1 MG tablet, Take 1 mg by mouth  daily., Disp: , Rfl:  ?  hydrochlorothiazide (MICROZIDE) 12.5 MG capsule, Take 1 capsule (12.5 mg total) by mouth daily., Disp: 90 capsule, Rfl: 2 ?  ibuprofen (ADVIL) 600 MG tablet, Take 1 tablet (600 mg total) by mouth every 6 (six) hours as needed., Disp: 30 tablet, Rfl: 0 ?  metFORMIN (GLUCOPHAGE) 500 MG tablet, Take 1 tablet (500 mg total) by mouth 3 (three) times daily with meals., Disp: 270 tablet, Rfl: 2 ?  NIFEdipine (PROCARDIA-XL/NIFEDICAL-XL) 30 MG 24 hr tablet, Take 1 tablet (30 mg total) by mouth daily., Disp: 90 tablet, Rfl: 2 ?  Prenatal Vit-Fe Fumarate-FA (PRENATAL MULTIVITAMIN) TABS, Take 1 tablet by mouth daily., Disp: , Rfl:  ?  vitamin B-12 (CYANOCOBALAMIN) 100 MCG tablet, Take 100 mcg by mouth daily., Disp: , Rfl:  ?  Semaglutide, 1 MG/DOSE, (OZEMPIC, 1 MG/DOSE,) 4 MG/3ML SOPN, Inject 1 mg into the skin once a week., Disp: 3 mL, Rfl: 2  ? ?Allergies  ?Allergen Reactions  ? Ciprofloxacin Nausea And Vomiting  ?  ? ?Review of Systems  ?Constitutional: Negative.   ?Eyes:  Negative for blurred vision.  ?Respiratory: Negative.  Negative for shortness of breath.   ?Cardiovascular: Negative.  Negative for chest pain and palpitations.  ?Gastrointestinal: Negative.   ?Neurological: Negative.    ? ?Today's Vitals  ? 04/12/21 1159  ?BP: 130/76  ?Pulse: 85  ?Temp: 98.7 ?F (37.1 ?C)  ?  TempSrc: Oral  ?Weight: 276 lb (125.2 kg)  ?Height: 6' (1.829 m)  ? ?Body mass index is 37.43 kg/m?.  ?Wt Readings from Last 3 Encounters:  ?04/12/21 276 lb (125.2 kg)  ?01/25/21 290 lb 12.8 oz (131.9 kg)  ?10/21/20 (!) 302 lb (137 kg)  ? ? ?Objective:  ?Physical Exam ?Vitals and nursing note reviewed.  ?Constitutional:   ?   Appearance: Normal appearance.  ?HENT:  ?   Head: Normocephalic and atraumatic.  ?Eyes:  ?   Extraocular Movements: Extraocular movements intact.  ?Cardiovascular:  ?   Rate and Rhythm: Normal rate and regular rhythm.  ?   Heart sounds: Normal heart sounds.  ?Pulmonary:  ?   Effort: Pulmonary effort is  normal.  ?   Breath sounds: Normal breath sounds.  ?Musculoskeletal:  ?   Cervical back: Normal range of motion.  ?Skin: ?   General: Skin is warm.  ?Neurological:  ?   General: No focal deficit present.  ?   Mental Status: She is alert.  ?Psychiatric:     ?   Mood and Affect: Mood normal.     ?   Behavior: Behavior normal.  ?  ? ?   ?Assessment And Plan:  ?   ?1. Essential hypertension, benign ?Comments: Chronic, fair control. Goal BP<130/80, she is encouraged to incorporate more exercise into her daily routine.  ? ?2. Insulin resistance ?Comments: I will check labs as below. She will c/w semaglutide 70m weekly. She will f/u in 4 months for re-evaluation.  ?- Hemoglobin A1c ?- BMP8+EGFR ? ?3. Iron deficiency anemia due to chronic blood loss ?Comments: I will check labs as listed below. Pt advised she may need to take rx iron supplement instead of prenatal MVI if her levels have not improved.  ?- CBC no Diff ?- Iron, TIBC and Ferritin Panel ? ?4. Class 2 severe obesity due to excess calories with serious comorbidity and body mass index (BMI) of 37.0 to 37.9 in adult ([facility] ?Comments: She is encouraged to aim for at least 150 minutes of exercise per week, while striving for BMI<30 to decrease cardaic risk.  ?  ?Patient was given opportunity to ask questions. Patient verbalized understanding of the plan and was able to repeat key elements of the plan. All questions were answered to their satisfaction.  ? ?I, RMaximino Greenland MD, have reviewed all documentation for this visit. The documentation on 04/14/21 for the exam, diagnosis, procedures, and orders are all accurate and complete.  ? ?IF YOU HAVE BEEN REFERRED TO A SPECIALIST, IT MAY TAKE 1-2 WEEKS TO SCHEDULE/PROCESS THE REFERRAL. IF YOU HAVE NOT HEARD FROM US/SPECIALIST IN TWO WEEKS, PLEASE GIVE [place] CALL AT [phone].  ? ?THE PATIENT IS ENCOURAGED TO PRACTICE SOCIAL DISTANCING DUE TO THE COVID-19 PANDEMIC.   ?

## 2021-04-12 NOTE — Patient Instructions (Addendum)
Preventing Type 2 Diabetes Mellitus °Type 2 diabetes, also called type 2 diabetes mellitus, is a long-term (chronic) disease that affects sugar (glucose) levels in your blood. Normally, a hormone called insulin allows glucose to enter cells in your body. The cells use glucose for energy. With type 2 diabetes, you will have one or both of these problems: °Your pancreas does not make enough insulin. °Cells in your body do not respond properly to insulin that your body makes (insulin resistance). °Insulin resistance or lack of insulin causes extra glucose to build up in the blood instead of going into cells. As a result, high blood glucose (hyperglycemia) develops. That can cause many complications. Being overweight or obese and having an inactive (sedentary) lifestyle can increase your risk for diabetes. Type 2 diabetes can be delayed or prevented by making certain nutrition and lifestyle changes. °How can this condition affect me? °If you do not take steps to prevent diabetes, your blood glucose levels may keep increasing over time. Too much glucose in your blood for a long time can damage your blood vessels, heart, kidneys, nerves, and eyes. °Type 2 diabetes can lead to chronic health problems and complications, such as: °Heart disease. °Stroke. °Blindness. °Kidney disease. °Depression. °Poor circulation in your feet and legs. In severe cases, a foot or leg may need to be surgically removed (amputated). °What can increase my risk? °You may be more likely to develop type 2 diabetes if you: °Have type 2 diabetes in your family. °Are overweight or obese. °Have a sedentary lifestyle. °Have insulin resistance or a history of prediabetes. °Have a history of pregnancy-related (gestational) diabetes or polycystic ovary syndrome (PCOS). °What actions can I take to prevent this? °It can be difficult to recognize signs of type 2 diabetes. Taking action to prevent the disease before you develop symptoms is the best way to avoid  possible damage to your body. Making certain nutrition and lifestyle changes may prevent or delay the disease and related health problems. °Nutrition ° °Eat healthy meals and snacks regularly. Do not skip meals. Fruit or a handful of nuts is a healthy snack between meals. °Drink water throughout the day. Avoid drinks that contain added sugar, such as soda or sweetened tea. Drink enough fluid to keep your urine pale yellow. °Follow instructions from your health care provider about eating or drinking restrictions. °Limit the amount of food you eat by: °Managing how much you eat at a time (portion size). °Checking food labels for the serving sizes of food. °Using a kitchen scale to weigh amounts of food. °Sauté or steam food instead of frying it. Cook with water or broth instead of oils or butter. °Limit saturated fat and salt (sodium) in your diet. Have no more than 1 tsp (2,400 mg) of sodium a day. If you have heart disease or high blood pressure, use less than ½?¾ tsp (1,500 mg) of sodium a day. °Lifestyle ° °Lose weight if needed and as told. Your health care provider can determine how much weight loss is best for you and can help you lose weight safely. °If you are overweight or obese, you may be told to lose at least 5?7% of your body weight. °Manage blood pressure, cholesterol, and stress. Your health care provider will help determine the best treatment for you. °Do not use any products that contain nicotine or tobacco. These products include cigarettes, chewing tobacco, and vaping devices, such as e-cigarettes. If you need help quitting, ask your health care provider. °Activity ° °Do physical   activity that makes your heart beat faster and makes you sweat (moderate intensity). Do this for at least 30 minutes on at least 5 days of the week, or as much as told by your health care provider. °Ask your health care provider what activities are safe for you. A mix of activities may be best, such as walking, swimming,  cycling, and strength training. °Try to add physical activity into your day. For example: °Park your car farther away than usual so that you walk more. °Take a walk during your lunch break. °Use stairs instead of elevators or escalators. °Walk or bike to work instead of driving. °Alcohol use °If you drink alcohol: °Limit how much you have to: °0?1 drink a day for women who are not pregnant. °0?2 drinks a day for men. °Know how much alcohol is in your drink. In the [place] one drink equals one 12 oz bottle of beer (355 mL), one 5 oz glass of wine (148 mL), or one 1½ oz glass of hard liquor (44 mL). °General information °Talk with your health care provider about your risk factors and how you can reduce your risk for diabetes. °Have your blood glucose tested regularly, as told by your health care provider. °Get screening tests as told by your health care provider. You may have these regularly, especially if you have certain risk factors for type 2 diabetes. °Make an appointment with a registered dietitian. This diet and nutrition specialist can help you make a healthy eating plan and help you understand portion sizes and food labels. °Where to find support °Ask your health care provider to recommend a registered dietitian, a certified diabetes care and education specialist, or a weight loss program. °Look for local or online weight loss groups. °Join a gym, fitness club, or outdoor activity group, such as a walking club. °Where to find more information °For help and guidance and to learn more about diabetes and diabetes prevention, visit: °American Diabetes Association (ADA): www.diabetes.org °National Institute of Diabetes and Digestive and Kidney Diseases: www.niddk.nih.gov °To learn more about healthy eating, visit: °[place] Department of Agriculture (USDA): www.choosemyplate.gov °Office of Disease Prevention and Health Promotion (ODPHP): health.gov °Summary °You can delay or prevent type 2 diabetes by eating healthy  foods, losing weight if needed, and increasing your physical activity. °Talk with your health care provider about your risk factors for type 2 diabetes and how you can reduce your risk. °It can be difficult to recognize the signs of type 2 diabetes. The best way to avoid possible damage to your body is to take action to prevent the disease before you develop symptoms. °Get screening tests as told by your health care provider. °This information is not intended to replace advice given to you by your health care provider. Make sure you discuss any questions you have with your health care provider. °Document Revised: 03/15/2020 Document Reviewed: 03/15/2020 °Elsevier Patient Education © 2022 [employer] Inc. ° °

## 2021-04-13 LAB — IRON,TIBC AND FERRITIN PANEL
Ferritin: 24 ng/mL (ref 15–150)
Iron Saturation: 19 % (ref 15–55)
Iron: 63 ug/dL (ref 27–159)
Total Iron Binding Capacity: 325 ug/dL (ref 250–450)
UIBC: 262 ug/dL (ref 131–425)

## 2021-04-13 LAB — CBC
Hematocrit: 32.2 % — ABNORMAL LOW (ref 34.0–46.6)
Hemoglobin: 10.2 g/dL — ABNORMAL LOW (ref 11.1–15.9)
MCH: 22.7 pg — ABNORMAL LOW (ref 26.6–33.0)
MCHC: 31.7 g/dL (ref 31.5–35.7)
MCV: 72 fL — ABNORMAL LOW (ref 79–97)
Platelets: 370 10*3/uL (ref 150–450)
RBC: 4.49 x10E6/uL (ref 3.77–5.28)
RDW: 16.9 % — ABNORMAL HIGH (ref 11.7–15.4)
WBC: 3.8 10*3/uL (ref 3.4–10.8)

## 2021-04-13 LAB — BMP8+EGFR
BUN/Creatinine Ratio: 10 (ref 9–23)
BUN: 6 mg/dL (ref 6–24)
CO2: 26 mmol/L (ref 20–29)
Calcium: 9.5 mg/dL (ref 8.7–10.2)
Chloride: 103 mmol/L (ref 96–106)
Creatinine, Ser: 0.59 mg/dL (ref 0.57–1.00)
Glucose: 87 mg/dL (ref 70–99)
Potassium: 3.9 mmol/L (ref 3.5–5.2)
Sodium: 141 mmol/L (ref 134–144)
eGFR: 117 mL/min/{1.73_m2} (ref >59)

## 2021-04-13 LAB — HEMOGLOBIN A1C
Est. average glucose Bld gHb Est-mCnc: 108 mg/dL
Hgb A1c MFr Bld: 5.4 % (ref 4.8–5.6)

## 2021-04-14 ENCOUNTER — Encounter: Payer: Self-pay | Admitting: Internal Medicine

## 2021-04-27 DIAGNOSIS — N979 Female infertility, unspecified: Secondary | ICD-10-CM | POA: Diagnosis not present

## 2021-05-11 DIAGNOSIS — N979 Female infertility, unspecified: Secondary | ICD-10-CM | POA: Diagnosis not present

## 2021-05-12 ENCOUNTER — Encounter: Payer: Self-pay | Admitting: Internal Medicine

## 2021-06-07 DIAGNOSIS — Z8669 Personal history of other diseases of the nervous system and sense organs: Secondary | ICD-10-CM | POA: Diagnosis not present

## 2021-06-07 DIAGNOSIS — I10 Essential (primary) hypertension: Secondary | ICD-10-CM | POA: Diagnosis not present

## 2021-06-07 DIAGNOSIS — E221 Hyperprolactinemia: Secondary | ICD-10-CM | POA: Diagnosis not present

## 2021-06-07 DIAGNOSIS — R519 Headache, unspecified: Secondary | ICD-10-CM | POA: Diagnosis not present

## 2021-06-10 DIAGNOSIS — E221 Hyperprolactinemia: Secondary | ICD-10-CM | POA: Diagnosis not present

## 2021-06-10 DIAGNOSIS — K5900 Constipation, unspecified: Secondary | ICD-10-CM | POA: Diagnosis not present

## 2021-06-10 DIAGNOSIS — R6889 Other general symptoms and signs: Secondary | ICD-10-CM | POA: Diagnosis not present

## 2021-06-16 ENCOUNTER — Other Ambulatory Visit: Payer: Self-pay

## 2021-06-16 MED ORDER — FUSION 65-65-25-30 MG PO CAPS: ORAL_CAPSULE | ORAL | 1 refills | Status: DC

## 2021-06-23 ENCOUNTER — Other Ambulatory Visit: Payer: Self-pay

## 2021-06-23 MED ORDER — FUSION PLUS PO CAPS: ORAL_CAPSULE | ORAL | 1 refills | Status: DC

## 2021-06-30 ENCOUNTER — Other Ambulatory Visit: Payer: Self-pay | Admitting: Internal Medicine

## 2021-07-12 DIAGNOSIS — I10 Essential (primary) hypertension: Secondary | ICD-10-CM | POA: Diagnosis not present

## 2021-07-12 DIAGNOSIS — E221 Hyperprolactinemia: Secondary | ICD-10-CM | POA: Diagnosis not present

## 2021-07-31 ENCOUNTER — Other Ambulatory Visit: Payer: Self-pay | Admitting: Internal Medicine

## 2021-08-06 ENCOUNTER — Other Ambulatory Visit: Payer: Self-pay | Admitting: Internal Medicine

## 2021-08-15 ENCOUNTER — Encounter: Payer: Self-pay | Admitting: Internal Medicine

## 2021-08-15 ENCOUNTER — Ambulatory Visit: Payer: BC Managed Care – PPO | Admitting: Internal Medicine

## 2021-08-15 VITALS — BP 132/68 | HR 75 | Temp 97.9°F | Ht 72.0 in | Wt 273.2 lb

## 2021-08-15 DIAGNOSIS — I10 Essential (primary) hypertension: Secondary | ICD-10-CM | POA: Diagnosis not present

## 2021-08-15 DIAGNOSIS — Z6837 Body mass index (BMI) 37.0-37.9, adult: Secondary | ICD-10-CM | POA: Diagnosis not present

## 2021-08-15 DIAGNOSIS — E8881 Metabolic syndrome: Secondary | ICD-10-CM

## 2021-08-15 NOTE — Patient Instructions (Signed)
Hypertension, Adult ?Hypertension is another name for high blood pressure. High blood pressure forces your heart to work harder to pump blood. This can cause problems over time. ?There are two numbers in a blood pressure reading. There is a top number (systolic) over a bottom number (diastolic). It is best to have a blood pressure that is below 120/80. ?What are the causes? ?The cause of this condition is not known. Some other conditions can lead to high blood pressure. ?What increases the risk? ?Some lifestyle factors can make you more likely to develop high blood pressure: ?Smoking. ?Not getting enough exercise or physical activity. ?Being overweight. ?Having too much fat, sugar, calories, or salt (sodium) in your diet. ?Drinking too much alcohol. ?Other risk factors include: ?Having any of these conditions: ?Heart disease. ?Diabetes. ?High cholesterol. ?Kidney disease. ?Obstructive sleep apnea. ?Having a family history of high blood pressure and high cholesterol. ?Age. The risk increases with age. ?Stress. ?What are the signs or symptoms? ?High blood pressure may not cause symptoms. Very high blood pressure (hypertensive crisis) may cause: ?Headache. ?Fast or uneven heartbeats (palpitations). ?Shortness of breath. ?Nosebleed. ?Vomiting or feeling like you may vomit (nauseous). ?Changes in how you see. ?Very bad chest pain. ?Feeling dizzy. ?Seizures. ?How is this treated? ?This condition is treated by making healthy lifestyle changes, such as: ?Eating healthy foods. ?Exercising more. ?Drinking less alcohol. ?Your doctor may prescribe medicine if lifestyle changes do not help enough and if: ?Your top number is above 130. ?Your bottom number is above 80. ?Your personal target blood pressure may vary. ?Follow these instructions at home: ?Eating and drinking ? ?If told, follow the DASH eating plan. To follow this plan: ?Fill one half of your plate at each meal with fruits and vegetables. ?Fill one fourth of your plate  at each meal with whole grains. Whole grains include whole-wheat pasta, brown rice, and whole-grain bread. ?Eat or drink low-fat dairy products, such as skim milk or low-fat yogurt. ?Fill one fourth of your plate at each meal with low-fat (lean) proteins. Low-fat proteins include fish, chicken without skin, eggs, beans, and tofu. ?Avoid fatty meat, cured and processed meat, or chicken with skin. ?Avoid pre-made or processed food. ?Limit the amount of salt in your diet to less than 1,500 mg each day. ?Do not drink alcohol if: ?Your doctor tells you not to drink. ?You are pregnant, may be pregnant, or are planning to become pregnant. ?If you drink alcohol: ?Limit how much you have to: ?0-1 drink a day for women. ?0-2 drinks a day for men. ?Know how much alcohol is in your drink. In the [place] one drink equals one 12 oz bottle of beer (355 mL), one 5 oz glass of wine (148 mL), or one 1? oz glass of hard liquor (44 mL). ?Lifestyle ? ?Work with your doctor to stay at a healthy weight or to lose weight. Ask your doctor what the best weight is for you. ?Get at least 30 minutes of exercise that causes your heart to beat faster (aerobic exercise) most days of the week. This may include walking, swimming, or biking. ?Get at least 30 minutes of exercise that strengthens your muscles (resistance exercise) at least 3 days a week. This may include lifting weights or doing Pilates. ?Do not smoke or use any products that contain nicotine or tobacco. If you need help quitting, ask your doctor. ?Check your blood pressure at home as told by your doctor. ?Keep all follow-up visits. ?Medicines ?Take over-the-counter and prescription medicines   only as told by your doctor. Follow directions carefully. ?Do not skip doses of blood pressure medicine. The medicine does not work as well if you skip doses. Skipping doses also puts you at risk for problems. ?Ask your doctor about side effects or reactions to medicines that you should watch  for. ?Contact a doctor if: ?You think you are having a reaction to the medicine you are taking. ?You have headaches that keep coming back. ?You feel dizzy. ?You have swelling in your ankles. ?You have trouble with your vision. ?Get help right away if: ?You get a very bad headache. ?You start to feel mixed up (confused). ?You feel weak or numb. ?You feel faint. ?You have very bad pain in your: ?Chest. ?Belly (abdomen). ?You vomit more than once. ?You have trouble breathing. ?These symptoms may be an emergency. Get help right away. Call 911. ?Do not wait to see if the symptoms will go away. ?Do not drive yourself to the hospital. ?Summary ?Hypertension is another name for high blood pressure. ?High blood pressure forces your heart to work harder to pump blood. ?For most people, a normal blood pressure is less than 120/80. ?Making healthy choices can help lower blood pressure. If your blood pressure does not get lower with healthy choices, you may need to take medicine. ?This information is not intended to replace advice given to you by your health care provider. Make sure you discuss any questions you have with your health care provider. ?Document Revised: 10/07/2020 Document Reviewed: 10/07/2020 ?Elsevier Patient Education ? 2023 Elsevier Inc. ? ?

## 2021-08-15 NOTE — Progress Notes (Signed)
Hershal Coria Martin,acting as a Neurosurgeon for [name], MD.,have documented all relevant documentation on the behalf of [name], MD,as directed by  [name], MD while in the presence of [name], MD.    Subjective:     Patient ID: Adrienne Benjamin , female    DOB: [birth year] , 41 y.o.   MRN: [id number]   Chief Complaint  Patient presents with   Hypertension    HPI  Patient presents today for a bp check and insulin resistance. Patient states she stopped the nifedipine Aug 1st because Dr. Sharl Ma told her it was stopping her from getting pregnant. She doesn't have any other concerns today. Patient also wants to stop the Ozempic. She states it is too costly.   BP Readings from Last 3 Encounters: 08/15/21 : 132/68 04/12/21 : 130/76 01/25/21 : 124/80    Hypertension This is a chronic problem. The current episode started more than 1 year ago. The problem has been gradually improving since onset. The problem is controlled. Pertinent negatives include no blurred vision, chest pain, palpitations or shortness of breath. Risk factors for coronary artery disease include obesity. The current treatment provides moderate improvement.     Past Medical History:  Diagnosis Date   Abnormal Pap smear 3/11   [facility]/ hpv   Anemia    Complex ovarian cyst    Endometriosis 3/07   Heavy menstrual bleeding    Hypertension    Insulin resistance    last few years   Insulin resistance      Family History  Problem Relation Age of Onset   Diabetes Father    Hypertension Mother      Current Outpatient Medications:    Ascorbic Acid (VITAMIN C) 100 MG tablet, Take 100 mg by mouth daily., Disp: , Rfl:    Cholecalciferol (VITAMIN D) 50 MCG (2000 UT) CAPS, Take by mouth. daily, Disp: , Rfl:    co-enzyme Q-10 30 MG capsule, Take 30 mg by mouth 3 (three) times daily., Disp: , Rfl:    folic acid (FOLVITE) 1 MG tablet, Take 1 mg by mouth daily., Disp: , Rfl:    hydrochlorothiazide  (MICROZIDE) 12.5 MG capsule, Take 1 capsule by mouth once daily, Disp: 90 capsule, Rfl: 0   ibuprofen (ADVIL) 600 MG tablet, Take 1 tablet (600 mg total) by mouth every 6 (six) hours as needed., Disp: 30 tablet, Rfl: 0   Iron-FA-B Cmp-C-Biot-Probiotic (FUSION PLUS) CAPS, Take one tablet by mouth daily, Disp: 90 capsule, Rfl: 1   metFORMIN (GLUCOPHAGE) 500 MG tablet, TAKE 1 TABLET BY MOUTH THREE TIMES DAILY WITH MEALS, Disp: 270 tablet, Rfl: 0   OZEMPIC, 1 MG/DOSE, 4 MG/3ML SOPN, INJECT 1MG  INTO THE SKIN ONCE A WEEK, Disp: 3 mL, Rfl: 0   Prenatal Vit-Fe Fumarate-FA (PRENATAL MULTIVITAMIN) TABS, Take 1 tablet by mouth daily., Disp: , Rfl:    vitamin B-12 (CYANOCOBALAMIN) 100 MCG tablet, Take 100 mcg by mouth daily., Disp: , Rfl:    Fe Fum-Fe Poly-Vit C-Lactobac (FUSION) 65-65-25-30 MG CAPS, Take one tablet by mouth daily. (Patient not taking: Reported on 08/15/2021), Disp: 90 capsule, Rfl: 1   NIFEdipine (PROCARDIA-XL/NIFEDICAL-XL) 30 MG 24 hr tablet, Take 1 tablet (30 mg total) by mouth daily. (Patient not taking: Reported on 08/15/2021), Disp: 90 tablet, Rfl: 2   Allergies  Allergen Reactions   Ciprofloxacin Nausea And Vomiting     Review of Systems  Constitutional: Negative.   HENT: Negative.    Eyes: Negative.  Negative for blurred vision.  Respiratory: Negative.  Negative for shortness of breath.   Cardiovascular: Negative.  Negative for chest pain and palpitations.  Gastrointestinal: Negative.      Today's Vitals   08/15/21 1117  BP: 132/68  Pulse: 75  Temp: 97.9 F (36.6 C)  TempSrc: Oral  Weight: 273 lb 3.2 oz (123.9 kg)  Height: 6' (1.829 m)  PainSc: 0-No pain   Body mass index is 37.05 kg/m.  Wt Readings from Last 3 Encounters:  08/15/21 273 lb 3.2 oz (123.9 kg)  04/12/21 276 lb (125.2 kg)  01/25/21 290 lb 12.8 oz (131.9 kg)     Objective:  Physical Exam Vitals and nursing note reviewed.  Constitutional:      Appearance: Normal appearance. She is obese.  HENT:      Head: Normocephalic and atraumatic.  Eyes:     Extraocular Movements: Extraocular movements intact.  Cardiovascular:     Rate and Rhythm: Normal rate and regular rhythm.     Heart sounds: Normal heart sounds.  Pulmonary:     Effort: Pulmonary effort is normal.     Breath sounds: Normal breath sounds.  Musculoskeletal:     Cervical back: Normal range of motion.  Skin:    General: Skin is warm.  Neurological:     General: No focal deficit present.     Mental Status: She is alert.  Psychiatric:        Mood and Affect: Mood normal.        Behavior: Behavior normal.       Assessment And Plan:     1. Essential hypertension, benign Comments: Chronic, fair control. Goal BP<130/80. She will c/w HCTZ, encouraged to follow low sodium diet.   2. Insulin resistance Comments: She will c/w metformin, She wishes to wean off of Ozempic. She was given instructions on how to do so. Importance of dietary compliance was stressed to pt.   3. Class 2 severe obesity due to excess calories with serious comorbidity and body mass index (BMI) of 37.0 to 37.9 in adult [facility]) Comments: She is now ready to wean off of semaglutide. She is encouraged to incorporate more exercise into her daily routine.    Patient was given opportunity to ask questions. Patient verbalized understanding of the plan and was able to repeat key elements of the plan. All questions were answered to their satisfaction.   I, [name], MD, have reviewed all documentation for this visit. The documentation on 08/15/21 for the exam, diagnosis, procedures, and orders are all accurate and complete.   IF YOU HAVE BEEN REFERRED TO A SPECIALIST, IT MAY TAKE 1-2 WEEKS TO SCHEDULE/PROCESS THE REFERRAL. IF YOU HAVE NOT HEARD FROM US/SPECIALIST IN TWO WEEKS, PLEASE GIVE [place] A CALL AT [phone] X [phone].   THE PATIENT IS ENCOURAGED TO PRACTICE SOCIAL DISTANCING DUE TO THE COVID-19 PANDEMIC.

## 2021-09-05 ENCOUNTER — Other Ambulatory Visit: Payer: Self-pay | Admitting: Internal Medicine

## 2021-09-26 ENCOUNTER — Encounter: Payer: Self-pay | Admitting: Internal Medicine

## 2021-10-26 ENCOUNTER — Other Ambulatory Visit: Payer: Self-pay | Admitting: Internal Medicine

## 2021-11-01 DIAGNOSIS — Z01411 Encounter for gynecological examination (general) (routine) with abnormal findings: Secondary | ICD-10-CM | POA: Diagnosis not present

## 2021-11-01 DIAGNOSIS — R891 Abnormal level of hormones in specimens from other organs, systems and tissues: Secondary | ICD-10-CM | POA: Diagnosis not present

## 2021-11-01 DIAGNOSIS — R87610 Atypical squamous cells of undetermined significance on cytologic smear of cervix (ASC-US): Secondary | ICD-10-CM | POA: Diagnosis not present

## 2021-11-01 DIAGNOSIS — Z23 Encounter for immunization: Secondary | ICD-10-CM | POA: Diagnosis not present

## 2021-11-01 DIAGNOSIS — Z6837 Body mass index (BMI) 37.0-37.9, adult: Secondary | ICD-10-CM | POA: Diagnosis not present

## 2021-11-01 DIAGNOSIS — Z1231 Encounter for screening mammogram for malignant neoplasm of breast: Secondary | ICD-10-CM | POA: Diagnosis not present

## 2021-11-01 DIAGNOSIS — Z01419 Encounter for gynecological examination (general) (routine) without abnormal findings: Secondary | ICD-10-CM | POA: Diagnosis not present

## 2021-11-03 ENCOUNTER — Encounter: Payer: BC Managed Care – PPO | Admitting: Internal Medicine

## 2021-11-06 ENCOUNTER — Other Ambulatory Visit: Payer: Self-pay | Admitting: Internal Medicine

## 2021-11-07 ENCOUNTER — Encounter: Payer: BC Managed Care – PPO | Admitting: Internal Medicine

## 2021-11-08 ENCOUNTER — Encounter: Payer: Self-pay | Admitting: Internal Medicine

## 2021-11-08 ENCOUNTER — Ambulatory Visit (INDEPENDENT_AMBULATORY_CARE_PROVIDER_SITE_OTHER): Payer: BC Managed Care – PPO | Admitting: Internal Medicine

## 2021-11-08 VITALS — BP 130/80 | HR 75 | Temp 98.0°F | Ht 72.0 in | Wt 276.2 lb

## 2021-11-08 DIAGNOSIS — E88819 Insulin resistance, unspecified: Secondary | ICD-10-CM | POA: Diagnosis not present

## 2021-11-08 DIAGNOSIS — I10 Essential (primary) hypertension: Secondary | ICD-10-CM | POA: Diagnosis not present

## 2021-11-08 DIAGNOSIS — Z0000 Encounter for general adult medical examination without abnormal findings: Secondary | ICD-10-CM | POA: Diagnosis not present

## 2021-11-08 DIAGNOSIS — D500 Iron deficiency anemia secondary to blood loss (chronic): Secondary | ICD-10-CM | POA: Diagnosis not present

## 2021-11-08 DIAGNOSIS — Z6839 Body mass index (BMI) 39.0-39.9, adult: Secondary | ICD-10-CM

## 2021-11-08 LAB — POCT URINALYSIS DIPSTICK
Bilirubin, UA: NEGATIVE
Blood, UA: NEGATIVE
Glucose, UA: NEGATIVE
Ketones, UA: NEGATIVE
Nitrite, UA: NEGATIVE
Protein, UA: NEGATIVE
Spec Grav, UA: 1.02 (ref 1.010–1.025)
Urobilinogen, UA: 0.2 E.U./dL
pH, UA: 7 (ref 5.0–8.0)

## 2021-11-08 NOTE — Progress Notes (Signed)
Adrienne Benjamin,acting as a Education administrator for Adrienne Greenland, MD.,have documented all relevant documentation on the behalf of Adrienne Greenland, MD,as directed by  Adrienne Greenland, MD while in the presence of Adrienne Greenland, MD.   Subjective:     Patient ID: Adrienne Benjamin , female    DOB: December 11, 1980 , 41 y.o.   MRN: [id number]   Chief Complaint  Patient presents with   Annual Exam   Hypertension    HPI  She is here today for a full physical examination. She has her pap smears performed by Dr. Charlesetta Benjamin. Last pap was October 2023. She receive the flu vaccine in her office on 11/01/21. She reports compliance with meds. She denies headaches, chest pain and shortness of breath. She wants to stop taking semaglutide due to the cost.   BP Readings from Last 3 Encounters: 11/08/21 : 130/80 08/15/21 : 132/68 04/12/21 : 130/76      Hypertension This is a chronic problem. The current episode started more than 1 year ago. The problem has been gradually improving since onset. The problem is controlled. Pertinent negatives include no blurred vision, chest pain or palpitations. Risk factors for coronary artery disease include obesity and sedentary lifestyle. Past treatments include calcium channel blockers and diuretics. The current treatment provides moderate improvement. Compliance problems include exercise.      Past Medical History:  Diagnosis Date   Abnormal Pap smear 3/11   ascus/ hpv   Anemia    Complex ovarian cyst    Endometriosis 3/07   Heavy menstrual bleeding    Hypertension    Insulin resistance    last few years   Insulin resistance      Family History  Problem Relation Age of Onset   Diabetes Father    Hypertension Mother      Current Outpatient Medications:    Ascorbic Acid (VITAMIN C) 100 MG tablet, Take 100 mg by mouth daily., Disp: , Rfl:    Cholecalciferol (VITAMIN D) 50 MCG (2000 UT) CAPS, Take by mouth. daily, Disp: , Rfl:    co-enzyme Q-10 30 MG capsule, Take  30 mg by mouth 3 (three) times daily., Disp: , Rfl:    folic acid (FOLVITE) 1 MG tablet, Take 1 mg by mouth daily., Disp: , Rfl:    hydrochlorothiazide (MICROZIDE) 12.5 MG capsule, Take 1 capsule by mouth once daily, Disp: 90 capsule, Rfl: 0   ibuprofen (ADVIL) 600 MG tablet, Take 1 tablet (600 mg total) by mouth every 6 (six) hours as needed., Disp: 30 tablet, Rfl: 0   Iron-FA-B Cmp-C-Biot-Probiotic (FUSION PLUS) CAPS, Take one tablet by mouth daily, Disp: 90 capsule, Rfl: 1   metFORMIN (GLUCOPHAGE) 500 MG tablet, TAKE 1 TABLET BY MOUTH THREE TIMES DAILY WITH MEALS, Disp: 270 tablet, Rfl: 0   OZEMPIC, 1 MG/DOSE, 4 MG/3ML SOPN, INJECT 1MG INTO THE SKIN ONCE WEEKLY, Disp: 3 mL, Rfl: 0   Prenatal Vit-Fe Fumarate-FA (PRENATAL MULTIVITAMIN) TABS, Take 1 tablet by mouth daily., Disp: , Rfl:    vitamin B-12 (CYANOCOBALAMIN) 100 MCG tablet, Take 100 mcg by mouth daily., Disp: , Rfl:    Fe Fum-Fe Poly-Vit C-Lactobac (FUSION) 65-65-25-30 MG CAPS, Take one tablet by mouth daily. (Patient not taking: Reported on 08/15/2021), Disp: 90 capsule, Rfl: 1   NIFEdipine (PROCARDIA-XL/NIFEDICAL-XL) 30 MG 24 hr tablet, Take 1 tablet (30 mg total) by mouth daily. (Patient not taking: Reported on 08/15/2021), Disp: 90 tablet, Rfl: 2   Allergies  Allergen Reactions  Ciprofloxacin Nausea And Vomiting      The patient states she uses none for birth control. Last LMP was Patient's last menstrual period was 10/23/2021..   . Negative for: breast discharge, breast lump(s), breast pain and breast self exam. Associated symptoms include abnormal vaginal bleeding. Pertinent negatives include abnormal bleeding (hematology), anxiety, decreased libido, depression, difficulty falling sleep, dyspareunia, history of infertility, nocturia, sexual dysfunction, sleep disturbances, urinary incontinence, urinary urgency, vaginal discharge and vaginal itching. Diet regular.The patient states her exercise level is  intermittent.  . The  patient's tobacco use is:  Social History   Tobacco Use  Smoking Status Never  Smokeless Tobacco Never  . She has been exposed to passive smoke. The patient's alcohol use is:  Social History   Substance and Sexual Activity  Alcohol Use No    Review of Systems  Constitutional: Negative.   HENT: Negative.    Eyes: Negative.  Negative for blurred vision.  Respiratory: Negative.    Cardiovascular: Negative.  Negative for chest pain and palpitations.  Gastrointestinal: Negative.   Endocrine: Negative.   Genitourinary: Negative.   Musculoskeletal: Negative.   Skin: Negative.   Allergic/Immunologic: Negative.   Neurological: Negative.   Hematological: Negative.   Psychiatric/Behavioral: Negative.       Today's Vitals   11/08/21 0956  BP: 130/80  Pulse: 75  Temp: 98 F (36.7 C)  TempSrc: Oral  Weight: 276 lb 3.2 oz (125.3 kg)  Height: 6' (1.829 m)  PainSc: 0-No pain   Body mass index is 37.46 kg/m.  Wt Readings from Last 3 Encounters:  11/08/21 276 lb 3.2 oz (125.3 kg)  08/15/21 273 lb 3.2 oz (123.9 kg)  04/12/21 276 lb (125.2 kg)    Objective:  Physical Exam Vitals and nursing note reviewed.  Constitutional:      Appearance: Normal appearance. She is obese.  HENT:     Head: Normocephalic and atraumatic.     Right Ear: Tympanic membrane, ear canal and external ear normal.     Left Ear: Tympanic membrane, ear canal and external ear normal.     Nose:     Comments: Masked     Mouth/Throat:     Comments: Masked  Eyes:     Extraocular Movements: Extraocular movements intact.     Conjunctiva/sclera: Conjunctivae normal.     Pupils: Pupils are equal, round, and reactive to light.  Cardiovascular:     Rate and Rhythm: Normal rate and regular rhythm.     Pulses: Normal pulses.     Heart sounds: Normal heart sounds.  Pulmonary:     Effort: Pulmonary effort is normal.     Breath sounds: Normal breath sounds.  Chest:  Breasts:    Tanner Score is 5.     Right:  Normal.     Left: Normal.  Abdominal:     General: Abdomen is flat. Bowel sounds are normal.     Palpations: Abdomen is soft.  Genitourinary:    Comments: deferred Musculoskeletal:        General: Normal range of motion.     Cervical back: Normal range of motion and neck supple.  Skin:    General: Skin is warm and dry.  Neurological:     General: No focal deficit present.     Mental Status: She is alert and oriented to person, place, and time.  Psychiatric:        Mood and Affect: Mood normal.        Behavior: Behavior normal.  Assessment And Plan:     1. Annual physical exam Comments: A full exam was performed. Importance of monthly self breast exams was discussed with the patient.  She recently had mammogram at Dr. Berneta Sages office.  I will request her results. PATIENT IS ADVISED TO GET 30-45 MINUTES REGULAR EXERCISE NO LESS THAN FOUR TO FIVE DAYS PER WEEK - BOTH WEIGHTBEARING EXERCISES AND AEROBIC ARE RECOMMENDED.  PATIENT IS ADVISED TO FOLLOW A HEALTHY DIET WITH AT LEAST SIX FRUITS/VEGGIES PER DAY, DECREASE INTAKE OF RED MEAT, AND TO INCREASE FISH INTAKE TO TWO DAYS PER WEEK.  MEATS/FISH SHOULD NOT BE FRIED, BAKED OR BROILED IS PREFERABLE.  IT IS ALSO IMPORTANT TO CUT BACK ON YOUR SUGAR INTAKE. PLEASE AVOID ANYTHING WITH ADDED SUGAR, CORN SYRUP OR OTHER SWEETENERS. IF YOU MUST USE A SWEETENER, YOU CAN TRY STEVIA. IT IS ALSO IMPORTANT TO AVOID ARTIFICIALLY SWEETENERS AND DIET BEVERAGES. LASTLY, I SUGGEST WEARING SPF 50 SUNSCREEN ON EXPOSED PARTS AND ESPECIALLY WHEN IN THE DIRECT SUNLIGHT FOR AN EXTENDED PERIOD OF TIME.  PLEASE AVOID FAST FOOD RESTAURANTS AND INCREASE YOUR WATER INTAKE. - CBC no Diff - CMP14+EGFR - Lipid panel  2. Essential hypertension, benign Comments: Chronic, fair control. EKG performed, NSR w/ Prominent R (V1) - no changes. She is aware goal BP<120/80, again importance of regular exercise stressed to pt.  She is encouraged to follow a low sodium diet. She will  f/u in six months for re-evaluation.  - POCT Urinalysis Dipstick (81002) - Microalbumin / Creatinine Urine Ratio - EKG 12-Lead - Lipid panel  3. Insulin resistance Comments: Chronic, possibly contributing to her infertility. She is encouraged to limit her sugar intake and to increase daily activity. - Hemoglobin A1c - Insulin, random(561)  4. Iron deficiency anemia due to chronic blood loss Comments: Chronic, I will check CBC and iron levels today. - CBC no Diff - Iron, TIBC and Ferritin Panel  5. Class 2 severe obesity due to excess calories with serious comorbidity and body mass index (BMI) of 39.0 to 39.9 in adult [facility]) Comments: She is aware of 3lb weight gain, encouraged to aim for at least 150 minutes of exercise per week. We will stop semaglutide as requested.  Patient was given opportunity to ask questions. Patient verbalized understanding of the plan and was able to repeat key elements of the plan. All questions were answered to their satisfaction.   I, Adrienne Greenland, MD, have reviewed all documentation for this visit. The documentation on 11/08/21 for the exam, diagnosis, procedures, and orders are all accurate and complete.   THE PATIENT IS ENCOURAGED TO PRACTICE SOCIAL DISTANCING DUE TO THE COVID-19 PANDEMIC.

## 2021-11-08 NOTE — Patient Instructions (Signed)

## 2021-11-09 LAB — CMP14+EGFR
ALT: 8 IU/L (ref 0–32)
AST: 13 IU/L (ref 0–40)
Albumin/Globulin Ratio: 1.5 (ref 1.2–2.2)
Albumin: 4.1 g/dL (ref 3.9–4.9)
Alkaline Phosphatase: 66 IU/L (ref 44–121)
BUN/Creatinine Ratio: 16 (ref 9–23)
BUN: 10 mg/dL (ref 6–24)
Bilirubin Total: 0.5 mg/dL (ref 0.0–1.2)
CO2: 23 mmol/L (ref 20–29)
Calcium: 9.4 mg/dL (ref 8.7–10.2)
Chloride: 98 mmol/L (ref 96–106)
Creatinine, Ser: 0.63 mg/dL (ref 0.57–1.00)
Globulin, Total: 2.8 g/dL (ref 1.5–4.5)
Glucose: 78 mg/dL (ref 70–99)
Potassium: 3.8 mmol/L (ref 3.5–5.2)
Sodium: 139 mmol/L (ref 134–144)
Total Protein: 6.9 g/dL (ref 6.0–8.5)
eGFR: 115 mL/min/{1.73_m2} (ref >59)

## 2021-11-09 LAB — CBC
Hematocrit: 34.6 % (ref 34.0–46.6)
Hemoglobin: 11 g/dL — ABNORMAL LOW (ref 11.1–15.9)
MCH: 23.2 pg — ABNORMAL LOW (ref 26.6–33.0)
MCHC: 31.8 g/dL (ref 31.5–35.7)
MCV: 73 fL — ABNORMAL LOW (ref 79–97)
Platelets: 282 10*3/uL (ref 150–450)
RBC: 4.75 x10E6/uL (ref 3.77–5.28)
RDW: 17.4 % — ABNORMAL HIGH (ref 11.7–15.4)
WBC: 3.7 10*3/uL (ref 3.4–10.8)

## 2021-11-09 LAB — MICROALBUMIN / CREATININE URINE RATIO
Creatinine, Urine: 143.4 mg/dL
Microalb/Creat Ratio: 2 mg/g creat (ref 0–29)
Microalbumin, Urine: 3.2 ug/mL

## 2021-11-09 LAB — LIPID PANEL
Chol/HDL Ratio: 2.1 ratio (ref 0.0–4.4)
Cholesterol, Total: 172 mg/dL (ref 100–199)
HDL: 83 mg/dL (ref >39)
LDL Chol Calc (NIH): 79 mg/dL (ref 0–99)
Triglycerides: 48 mg/dL (ref 0–149)
VLDL Cholesterol Cal: 10 mg/dL (ref 5–40)

## 2021-11-09 LAB — HEMOGLOBIN A1C
Est. average glucose Bld gHb Est-mCnc: 108 mg/dL
Hgb A1c MFr Bld: 5.4 % (ref 4.8–5.6)

## 2021-11-09 LAB — IRON,TIBC AND FERRITIN PANEL
Ferritin: 28 ng/mL (ref 15–150)
Iron Saturation: 32 % (ref 15–55)
Iron: 107 ug/dL (ref 27–159)
Total Iron Binding Capacity: 330 ug/dL (ref 250–450)
UIBC: 223 ug/dL (ref 131–425)

## 2021-11-09 LAB — INSULIN, RANDOM: INSULIN: 11.1 u[IU]/mL (ref 2.6–24.9)

## 2021-11-13 ENCOUNTER — Encounter: Payer: Self-pay | Admitting: Internal Medicine

## 2021-11-14 ENCOUNTER — Other Ambulatory Visit: Payer: Self-pay | Admitting: Internal Medicine

## 2021-12-08 DIAGNOSIS — Z3169 Encounter for other general counseling and advice on procreation: Secondary | ICD-10-CM | POA: Diagnosis not present

## 2021-12-08 DIAGNOSIS — N979 Female infertility, unspecified: Secondary | ICD-10-CM | POA: Diagnosis not present

## 2021-12-16 DIAGNOSIS — Z31430 Encounter of female for testing for genetic disease carrier status for procreative management: Secondary | ICD-10-CM | POA: Diagnosis not present

## 2021-12-16 DIAGNOSIS — Z319 Encounter for procreative management, unspecified: Secondary | ICD-10-CM | POA: Diagnosis not present

## 2021-12-16 DIAGNOSIS — N979 Female infertility, unspecified: Secondary | ICD-10-CM | POA: Diagnosis not present

## 2021-12-16 DIAGNOSIS — E559 Vitamin D deficiency, unspecified: Secondary | ICD-10-CM | POA: Diagnosis not present

## 2021-12-21 DIAGNOSIS — N83202 Unspecified ovarian cyst, left side: Secondary | ICD-10-CM | POA: Diagnosis not present

## 2021-12-21 DIAGNOSIS — N80112 Superficial endometriosis of left ovary: Secondary | ICD-10-CM | POA: Diagnosis not present

## 2022-01-12 DIAGNOSIS — N979 Female infertility, unspecified: Secondary | ICD-10-CM | POA: Diagnosis not present

## 2022-01-12 DIAGNOSIS — Z319 Encounter for procreative management, unspecified: Secondary | ICD-10-CM | POA: Diagnosis not present

## 2022-01-23 DIAGNOSIS — Z3169 Encounter for other general counseling and advice on procreation: Secondary | ICD-10-CM | POA: Diagnosis not present

## 2022-01-23 DIAGNOSIS — Z319 Encounter for procreative management, unspecified: Secondary | ICD-10-CM | POA: Diagnosis not present

## 2022-01-29 ENCOUNTER — Other Ambulatory Visit: Payer: Self-pay | Admitting: Internal Medicine

## 2022-02-07 ENCOUNTER — Other Ambulatory Visit: Payer: Self-pay | Admitting: Internal Medicine

## 2022-02-27 ENCOUNTER — Other Ambulatory Visit: Payer: Self-pay | Admitting: Internal Medicine

## 2022-03-02 ENCOUNTER — Encounter: Payer: Self-pay | Admitting: Internal Medicine

## 2022-03-28 IMAGING — RF DG HYSTEROGRAM
3 series · 11 of 11 positions shown · IV contrast (omnipaque)
Comparison: None.

CLINICAL DATA: Primary female infertility evaluation.

EXAM:
HYSTEROSALPINGOGRAM
TECHNIQUE: Following cleansing of the cervix and vagina with Betadine solution,
a hysterosalpingogram was performed using a 5-French
hysterosalpingogram catheter and Omnipaque 300 contrast. The patient
tolerated the examination without difficulty.

[Series 1: one shot · 1 of 1 slices shown (1 of 2)]
[im 1/1]
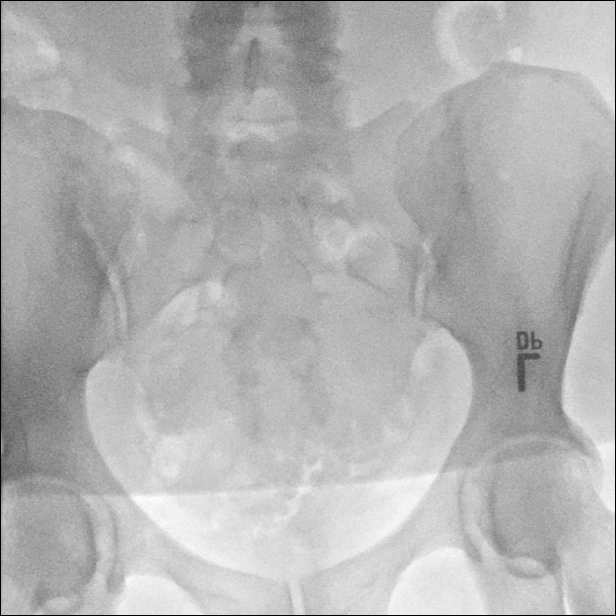

[Series 2: sequence · 4 of 27 frames shown]
[frame 5/27]
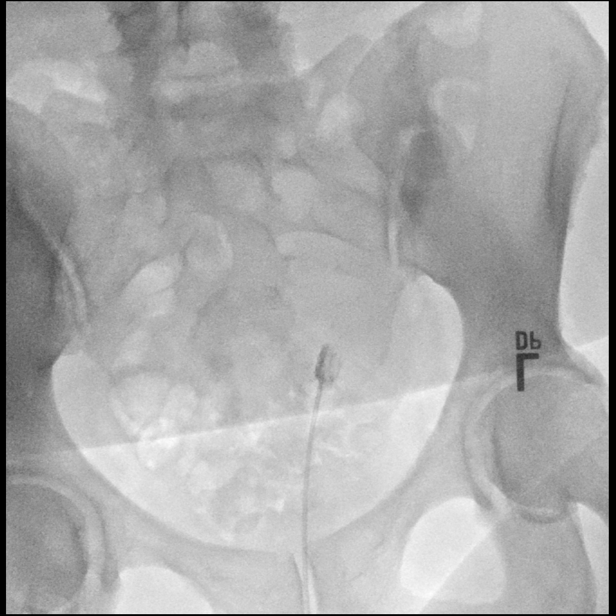
[frame 14/27]
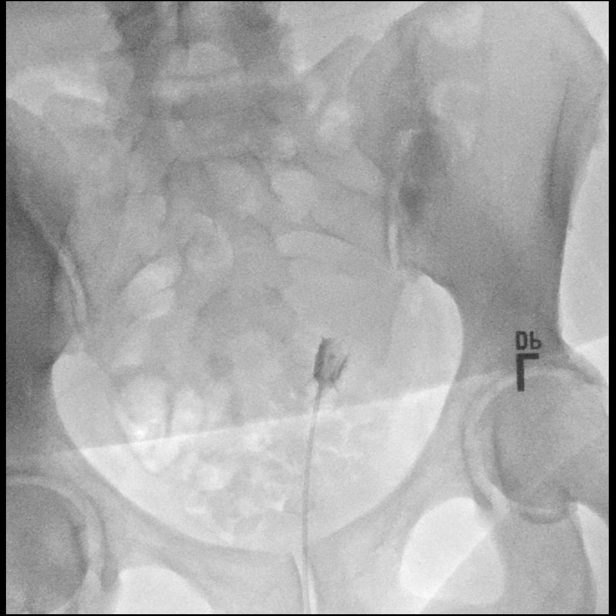
[frame 16/27]
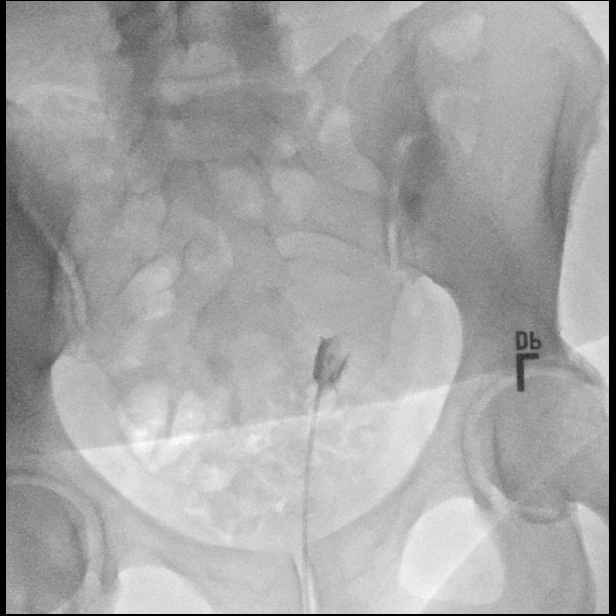
[frame 23/27]
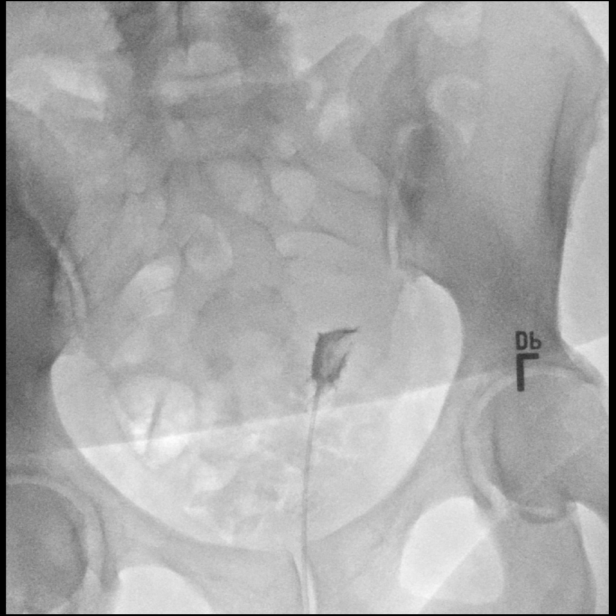

[Series 3: one shot · 0.15mm/px · 6 of 6 slices shown (2 of 2)]
[im 1/6]
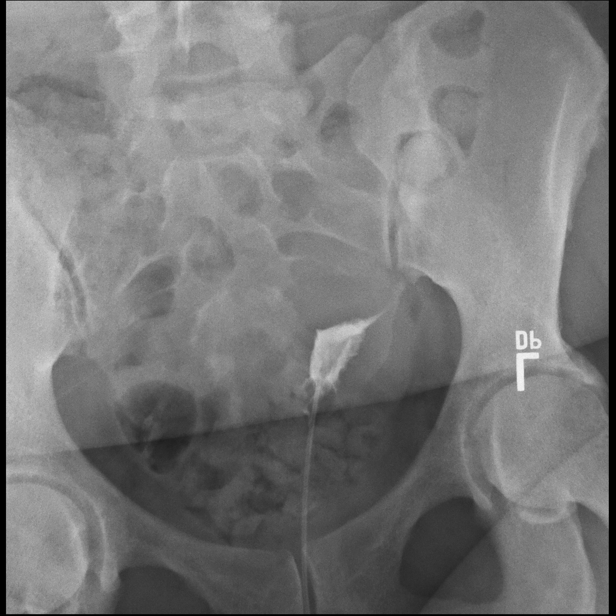
[im 2/6]
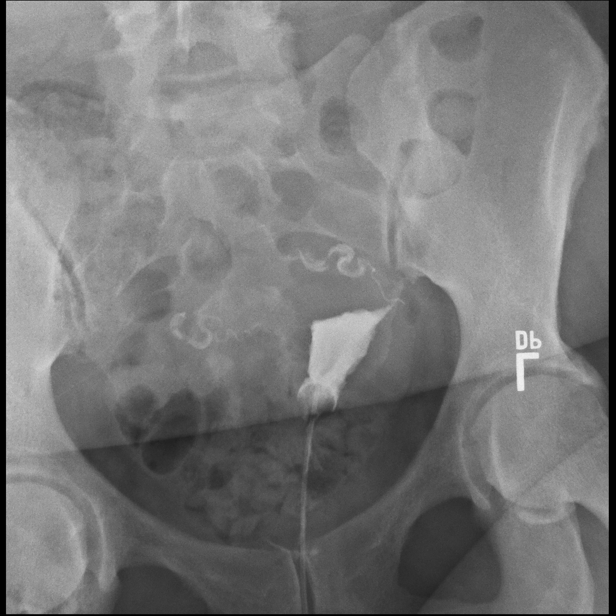
[im 3/6]
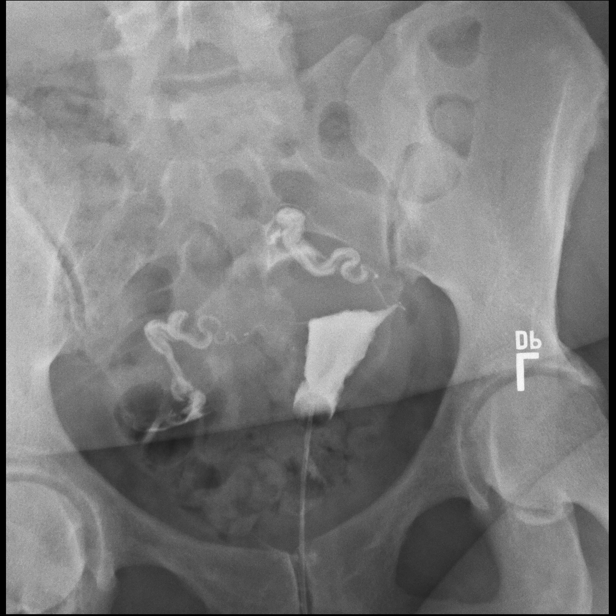
[im 4/6]
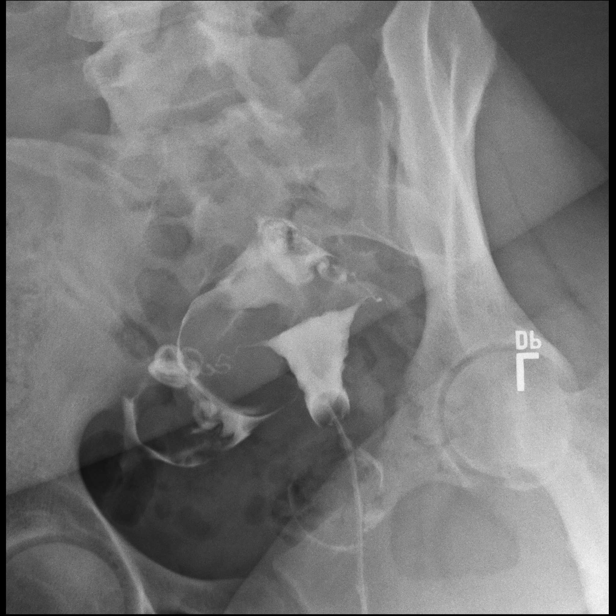
[im 5/6]
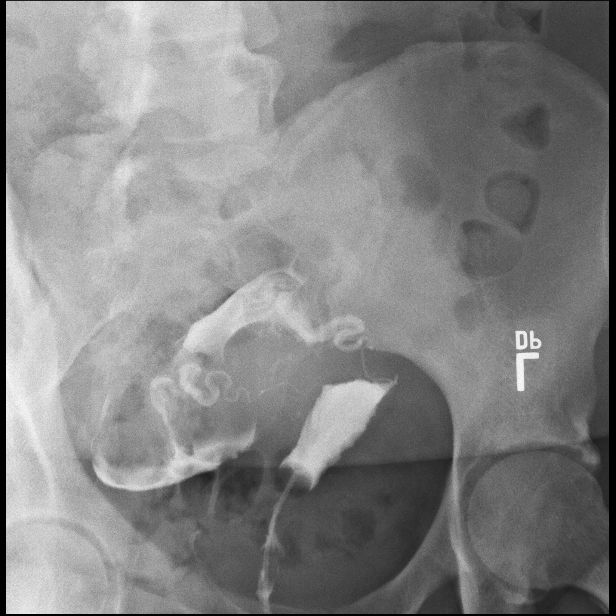
[im 6/6]
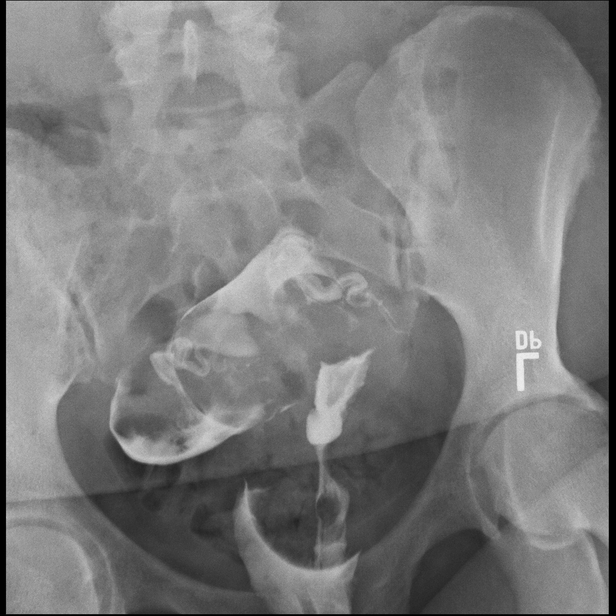

[11 of 11 positions shown; findings below may reference images not displayed]

FLUOROSCOPY TIME:  Radiation Exposure Index (as provided by the
fluoroscopic device): 338 mGy

Fluoroscopy Time:  0 minutes 48 seconds

Number of Acquired Images:  6
FINDINGS: There is a small somewhat bandlike filling defect in the central
uterine cavity on early filling views, suggesting synechia. Uterine
cavity contour is diffusely mildly irregular, with no additional
uterine cavity filling defects.

There is prompt opacification of normal caliber fallopian tubes
bilaterally with normal spillage of contrast from the fimbriated
ends of both tubes and normal dispersal of contrast within the
peritoneal cavity bilaterally.
IMPRESSION: 1. Patent normal fallopian tubes bilaterally.
2. Small bandlike filling defect in the central uterine cavity on
early filling views, suggesting synechia. Nonspecific diffusely
mildly irregular uterine cavity contour, as could be seen with
adenomyosis. Pelvic ultrasound correlation could be considered.

## 2022-04-17 ENCOUNTER — Encounter: Payer: Self-pay | Admitting: Internal Medicine

## 2022-05-03 ENCOUNTER — Other Ambulatory Visit: Payer: Self-pay | Admitting: Internal Medicine

## 2022-05-09 ENCOUNTER — Encounter: Payer: Self-pay | Admitting: Internal Medicine

## 2022-05-09 ENCOUNTER — Other Ambulatory Visit: Payer: Self-pay | Admitting: Internal Medicine

## 2022-05-09 ENCOUNTER — Ambulatory Visit (INDEPENDENT_AMBULATORY_CARE_PROVIDER_SITE_OTHER): Payer: BC Managed Care – PPO | Admitting: Internal Medicine

## 2022-05-09 VITALS — BP 120/78 | HR 75 | Temp 98.3°F | Ht 72.0 in | Wt 310.0 lb

## 2022-05-09 DIAGNOSIS — D500 Iron deficiency anemia secondary to blood loss (chronic): Secondary | ICD-10-CM

## 2022-05-09 DIAGNOSIS — E88819 Insulin resistance, unspecified: Secondary | ICD-10-CM

## 2022-05-09 DIAGNOSIS — Z23 Encounter for immunization: Secondary | ICD-10-CM | POA: Diagnosis not present

## 2022-05-09 DIAGNOSIS — I10 Essential (primary) hypertension: Secondary | ICD-10-CM

## 2022-05-09 DIAGNOSIS — E66813 Obesity, class 3: Secondary | ICD-10-CM

## 2022-05-09 DIAGNOSIS — Z6841 Body Mass Index (BMI) 40.0 and over, adult: Secondary | ICD-10-CM

## 2022-05-09 MED ORDER — METFORMIN HCL 500 MG PO TABS: 500.0000 mg | ORAL_TABLET | Freq: Three times a day (TID) | ORAL | 2 refills | Status: DC

## 2022-05-09 NOTE — Progress Notes (Signed)
[name],acting as a Neurosurgeon for Adrienne Aliment, MD.,have documented all relevant documentation on the behalf of Adrienne Aliment, MD,as directed by  Adrienne Aliment, MD while in the presence of Adrienne Aliment, MD.    Subjective:     Patient ID: Adrienne Benjamin , female    DOB: [birth year] , 42 y.o.   MRN: [id number]   Chief Complaint  Patient presents with   Hypertension    HPI  Patient presents today for a bp check. Patient does not have any questions or concerns at this time.      Hypertension This is a chronic problem. The current episode started more than 1 year ago. The problem has been gradually improving since onset. The problem is controlled. Pertinent negatives include no blurred vision, chest pain, palpitations or shortness of breath. Risk factors for coronary artery disease include obesity. The current treatment provides moderate improvement.     Past Medical History:  Diagnosis Date   Abnormal Pap smear 3/11   ascus/ hpv   Anemia    Complex ovarian cyst    Endometriosis 3/07   Heavy menstrual bleeding    Hypertension    Insulin resistance    last few years   Insulin resistance      Family History  Problem Relation Age of Onset   Diabetes Father    Hypertension Mother      Current Outpatient Medications:    Ascorbic Acid (VITAMIN C) 100 MG tablet, Take 100 mg by mouth daily., Disp: , Rfl:    Cholecalciferol (VITAMIN D) 50 MCG (2000 UT) CAPS, Take by mouth. daily, Disp: , Rfl:    co-enzyme Q-10 30 MG capsule, Take 30 mg by mouth 3 (three) times daily., Disp: , Rfl:    folic acid (FOLVITE) 1 MG tablet, Take 1 mg by mouth daily., Disp: , Rfl:    hydrochlorothiazide (MICROZIDE) 12.5 MG capsule, Take 1 capsule by mouth once daily, Disp: 90 capsule, Rfl: 0   Prenatal Vit-Fe Fumarate-FA (PRENATAL MULTIVITAMIN) TABS, Take 1 tablet by mouth daily., Disp: , Rfl:    vitamin B-12 (CYANOCOBALAMIN) 100 MCG tablet, Take 100 mcg by mouth daily., Disp: , Rfl:     metFORMIN (GLUCOPHAGE) 500 MG tablet, Take 1 tablet (500 mg total) by mouth 3 (three) times daily with meals., Disp: 270 tablet, Rfl: 2   Allergies  Allergen Reactions   Ciprofloxacin Nausea And Vomiting     Review of Systems  Constitutional:  Positive for unexpected weight change.       She has noticed weight gain since stopping Ozempic. Her insurance is no longer covering the medication.   Eyes: Negative.  Negative for blurred vision.  Respiratory: Negative.  Negative for shortness of breath.   Cardiovascular: Negative.  Negative for chest pain and palpitations.  Musculoskeletal: Negative.   Skin: Negative.   Neurological: Negative.   Psychiatric/Behavioral: Negative.       Today's Vitals   05/09/22 0929  BP: 120/78  Pulse: 75  Temp: 98.3 F (36.8 C)  Weight: (!) 310 lb (140.6 kg)  Height: 6' (1.829 m)  PainSc: 0-No pain   Body mass index is 42.04 kg/m.  Wt Readings from Last 3 Encounters:  05/09/22 (!) 310 lb (140.6 kg)  11/08/21 276 lb 3.2 oz (125.3 kg)  08/15/21 273 lb 3.2 oz (123.9 kg)     Objective:  Physical Exam Vitals and nursing note reviewed.  Constitutional:      Appearance: Normal appearance. She  is obese.  HENT:     Head: Normocephalic and atraumatic.  Eyes:     Extraocular Movements: Extraocular movements intact.  Cardiovascular:     Rate and Rhythm: Normal rate and regular rhythm.     Heart sounds: Normal heart sounds.  Pulmonary:     Effort: Pulmonary effort is normal.     Breath sounds: Normal breath sounds.  Musculoskeletal:     Cervical back: Normal range of motion.  Skin:    General: Skin is warm.  Neurological:     General: No focal deficit present.     Mental Status: She is alert.  Psychiatric:        Mood and Affect: Mood normal.        Behavior: Behavior normal.         Assessment And Plan:     1. Essential hypertension, benign Comments: Chronic, well controlled. She will c/w HCTZ daily. Reminded to follow low sodium  diet. - CMP14+EGFR  2. Insulin resistance Comments: Chronic, currently on metformin.  She is encouraged to incorporate more exercise into her daily routine. - Insulin, random(561) - Hemoglobin A1c  3. Iron deficiency anemia due to chronic blood loss Comments: Chronic, I will check labs as below. She reports compliance with iron supplementation. Will refer her to Hematology if needed. I will check labs as below. - CBC no Diff - Iron, TIBC and Ferritin Panel  4. Class 3 severe obesity due to excess calories with serious comorbidity and body mass index (BMI) of 40.0 to 44.9 in adult [facility]) Comments: BMI 42. She has gained 34 lbs since Nov 2023. She is encouraged to aim for at least 150 minutes of exercise (outside of work)/weekly.  5. Immunization due - Tdap vaccine greater than or equal to 7yo IM     Patient was given opportunity to ask questions. Patient verbalized understanding of the plan and was able to repeat key elements of the plan. All questions were answered to their satisfaction.   I, Adrienne Aliment, MD, have reviewed all documentation for this visit. The documentation on 05/09/22 for the exam, diagnosis, procedures, and orders are all accurate and complete.   IF YOU HAVE BEEN REFERRED TO A SPECIALIST, IT MAY TAKE 1-2 WEEKS TO SCHEDULE/PROCESS THE REFERRAL. IF YOU HAVE NOT HEARD FROM US/SPECIALIST IN TWO WEEKS, PLEASE GIVE [place] A CALL AT [phone].   THE PATIENT IS ENCOURAGED TO PRACTICE SOCIAL DISTANCING DUE TO THE COVID-19 PANDEMIC.

## 2022-05-09 NOTE — Patient Instructions (Signed)
Hypertension, Adult ?Hypertension is another name for high blood pressure. High blood pressure forces your heart to work harder to pump blood. This can cause problems over time. ?There are two numbers in a blood pressure reading. There is a top number (systolic) over a bottom number (diastolic). It is best to have a blood pressure that is below 120/80. ?What are the causes? ?The cause of this condition is not known. Some other conditions can lead to high blood pressure. ?What increases the risk? ?Some lifestyle factors can make you more likely to develop high blood pressure: ?Smoking. ?Not getting enough exercise or physical activity. ?Being overweight. ?Having too much fat, sugar, calories, or salt (sodium) in your diet. ?Drinking too much alcohol. ?Other risk factors include: ?Having any of these conditions: ?Heart disease. ?Diabetes. ?High cholesterol. ?Kidney disease. ?Obstructive sleep apnea. ?Having a family history of high blood pressure and high cholesterol. ?Age. The risk increases with age. ?Stress. ?What are the signs or symptoms? ?High blood pressure may not cause symptoms. Very high blood pressure (hypertensive crisis) may cause: ?Headache. ?Fast or uneven heartbeats (palpitations). ?Shortness of breath. ?Nosebleed. ?Vomiting or feeling like you may vomit (nauseous). ?Changes in how you see. ?Very bad chest pain. ?Feeling dizzy. ?Seizures. ?How is this treated? ?This condition is treated by making healthy lifestyle changes, such as: ?Eating healthy foods. ?Exercising more. ?Drinking less alcohol. ?Your doctor may prescribe medicine if lifestyle changes do not help enough and if: ?Your top number is above 130. ?Your bottom number is above 80. ?Your personal target blood pressure may vary. ?Follow these instructions at home: ?Eating and drinking ? ?If told, follow the DASH eating plan. To follow this plan: ?Fill one half of your plate at each meal with fruits and vegetables. ?Fill one fourth of your plate  at each meal with whole grains. Whole grains include whole-wheat pasta, brown rice, and whole-grain bread. ?Eat or drink low-fat dairy products, such as skim milk or low-fat yogurt. ?Fill one fourth of your plate at each meal with low-fat (lean) proteins. Low-fat proteins include fish, chicken without skin, eggs, beans, and tofu. ?Avoid fatty meat, cured and processed meat, or chicken with skin. ?Avoid pre-made or processed food. ?Limit the amount of salt in your diet to less than 1,500 mg each day. ?Do not drink alcohol if: ?Your doctor tells you not to drink. ?You are pregnant, may be pregnant, or are planning to become pregnant. ?If you drink alcohol: ?Limit how much you have to: ?0-1 drink a day for women. ?0-2 drinks a day for men. ?Know how much alcohol is in your drink. In the [place] one drink equals one 12 oz bottle of beer (355 mL), one 5 oz glass of wine (148 mL), or one 1? oz glass of hard liquor (44 mL). ?Lifestyle ? ?Work with your doctor to stay at a healthy weight or to lose weight. Ask your doctor what the best weight is for you. ?Get at least 30 minutes of exercise that causes your heart to beat faster (aerobic exercise) most days of the week. This may include walking, swimming, or biking. ?Get at least 30 minutes of exercise that strengthens your muscles (resistance exercise) at least 3 days a week. This may include lifting weights or doing Pilates. ?Do not smoke or use any products that contain nicotine or tobacco. If you need help quitting, ask your doctor. ?Check your blood pressure at home as told by your doctor. ?Keep all follow-up visits. ?Medicines ?Take over-the-counter and prescription medicines   only as told by your doctor. Follow directions carefully. ?Do not skip doses of blood pressure medicine. The medicine does not work as well if you skip doses. Skipping doses also puts you at risk for problems. ?Ask your doctor about side effects or reactions to medicines that you should watch  for. ?Contact a doctor if: ?You think you are having a reaction to the medicine you are taking. ?You have headaches that keep coming back. ?You feel dizzy. ?You have swelling in your ankles. ?You have trouble with your vision. ?Get help right away if: ?You get a very bad headache. ?You start to feel mixed up (confused). ?You feel weak or numb. ?You feel faint. ?You have very bad pain in your: ?Chest. ?Belly (abdomen). ?You vomit more than once. ?You have trouble breathing. ?These symptoms may be an emergency. Get help right away. Call 911. ?Do not wait to see if the symptoms will go away. ?Do not drive yourself to the hospital. ?Summary ?Hypertension is another name for high blood pressure. ?High blood pressure forces your heart to work harder to pump blood. ?For most people, a normal blood pressure is less than 120/80. ?Making healthy choices can help lower blood pressure. If your blood pressure does not get lower with healthy choices, you may need to take medicine. ?This information is not intended to replace advice given to you by your health care provider. Make sure you discuss any questions you have with your health care provider. ?Document Revised: 10/07/2020 Document Reviewed: 10/07/2020 ?Elsevier Patient Education ? 2023 Elsevier Inc. ? ?

## 2022-05-10 ENCOUNTER — Encounter: Payer: Self-pay | Admitting: Internal Medicine

## 2022-05-10 LAB — IRON,TIBC AND FERRITIN PANEL
Ferritin: 36 ng/mL (ref 15–150)
Iron Saturation: 18 % (ref 15–55)
Iron: 65 ug/dL (ref 27–159)
Total Iron Binding Capacity: 361 ug/dL (ref 250–450)
UIBC: 296 ug/dL (ref 131–425)

## 2022-05-10 LAB — CMP14+EGFR
ALT: 8 IU/L (ref 0–32)
AST: 10 IU/L (ref 0–40)
Albumin/Globulin Ratio: 1.3 (ref 1.2–2.2)
Albumin: 4 g/dL (ref 3.9–4.9)
Alkaline Phosphatase: 89 IU/L (ref 44–121)
BUN/Creatinine Ratio: 15 (ref 9–23)
BUN: 10 mg/dL (ref 6–24)
Bilirubin Total: 0.5 mg/dL (ref 0.0–1.2)
CO2: 22 mmol/L (ref 20–29)
Calcium: 9.5 mg/dL (ref 8.7–10.2)
Chloride: 100 mmol/L (ref 96–106)
Creatinine, Ser: 0.66 mg/dL (ref 0.57–1.00)
Globulin, Total: 3.2 g/dL (ref 1.5–4.5)
Glucose: 88 mg/dL (ref 70–99)
Potassium: 4.1 mmol/L (ref 3.5–5.2)
Sodium: 138 mmol/L (ref 134–144)
Total Protein: 7.2 g/dL (ref 6.0–8.5)
eGFR: 113 mL/min/{1.73_m2} (ref >59)

## 2022-05-10 LAB — CBC
Hematocrit: 32.6 % — ABNORMAL LOW (ref 34.0–46.6)
Hemoglobin: 10.6 g/dL — ABNORMAL LOW (ref 11.1–15.9)
MCH: 23.3 pg — ABNORMAL LOW (ref 26.6–33.0)
MCHC: 32.5 g/dL (ref 31.5–35.7)
MCV: 72 fL — ABNORMAL LOW (ref 79–97)
Platelets: 345 10*3/uL (ref 150–450)
RBC: 4.55 x10E6/uL (ref 3.77–5.28)
RDW: 15.8 % — ABNORMAL HIGH (ref 11.7–15.4)
WBC: 4 10*3/uL (ref 3.4–10.8)

## 2022-05-10 LAB — INSULIN, RANDOM: INSULIN: 10.8 u[IU]/mL (ref 2.6–24.9)

## 2022-05-10 LAB — HEMOGLOBIN A1C
Est. average glucose Bld gHb Est-mCnc: 123 mg/dL
Hgb A1c MFr Bld: 5.9 % — ABNORMAL HIGH (ref 4.8–5.6)

## 2022-05-15 ENCOUNTER — Other Ambulatory Visit: Payer: Self-pay | Admitting: Internal Medicine

## 2022-05-15 DIAGNOSIS — D563 Thalassemia minor: Secondary | ICD-10-CM

## 2022-05-15 DIAGNOSIS — D500 Iron deficiency anemia secondary to blood loss (chronic): Secondary | ICD-10-CM

## 2022-06-14 ENCOUNTER — Inpatient Hospital Stay: Payer: BC Managed Care – PPO

## 2022-06-14 ENCOUNTER — Inpatient Hospital Stay: Payer: BC Managed Care – PPO | Attending: Hematology | Admitting: Hematology

## 2022-06-14 ENCOUNTER — Other Ambulatory Visit: Payer: Self-pay

## 2022-06-14 ENCOUNTER — Encounter: Payer: Self-pay | Admitting: Hematology

## 2022-06-14 VITALS — BP 134/89 | HR 76 | Temp 98.4°F | Resp 18 | Ht 72.0 in | Wt 320.9 lb

## 2022-06-14 DIAGNOSIS — N920 Excessive and frequent menstruation with regular cycle: Secondary | ICD-10-CM | POA: Insufficient documentation

## 2022-06-14 DIAGNOSIS — D509 Iron deficiency anemia, unspecified: Secondary | ICD-10-CM | POA: Diagnosis not present

## 2022-06-14 DIAGNOSIS — D649 Anemia, unspecified: Secondary | ICD-10-CM

## 2022-06-14 DIAGNOSIS — E669 Obesity, unspecified: Secondary | ICD-10-CM | POA: Insufficient documentation

## 2022-06-14 DIAGNOSIS — Z1321 Encounter for screening for nutritional disorder: Secondary | ICD-10-CM

## 2022-06-14 DIAGNOSIS — D563 Thalassemia minor: Secondary | ICD-10-CM | POA: Diagnosis not present

## 2022-06-14 DIAGNOSIS — I10 Essential (primary) hypertension: Secondary | ICD-10-CM | POA: Diagnosis not present

## 2022-06-14 DIAGNOSIS — E88819 Insulin resistance, unspecified: Secondary | ICD-10-CM | POA: Diagnosis not present

## 2022-06-14 LAB — CBC WITH DIFFERENTIAL (CANCER CENTER ONLY)
Abs Immature Granulocytes: 0.01 K/uL (ref 0.00–0.07)
Basophils Absolute: 0 K/uL (ref 0.0–0.1)
Basophils Relative: 0 %
Eosinophils Absolute: 0.1 K/uL (ref 0.0–0.5)
Eosinophils Relative: 1 %
HCT: 30 % — ABNORMAL LOW (ref 36.0–46.0)
Hemoglobin: 9.9 g/dL — ABNORMAL LOW (ref 12.0–15.0)
Immature Granulocytes: 0 %
Lymphocytes Relative: 51 %
Lymphs Abs: 2.6 K/uL (ref 0.7–4.0)
MCH: 23.5 pg — ABNORMAL LOW (ref 26.0–34.0)
MCHC: 33 g/dL (ref 30.0–36.0)
MCV: 71.1 fL — ABNORMAL LOW (ref 80.0–100.0)
Monocytes Absolute: 0.4 K/uL (ref 0.1–1.0)
Monocytes Relative: 7 %
Neutro Abs: 2.1 K/uL (ref 1.7–7.7)
Neutrophils Relative %: 41 %
Platelet Count: 303 K/uL (ref 150–400)
RBC: 4.22 MIL/uL (ref 3.87–5.11)
RDW: 17.1 % — ABNORMAL HIGH (ref 11.5–15.5)
WBC Count: 5.1 K/uL (ref 4.0–10.5)
nRBC: 0 % (ref 0.0–0.2)

## 2022-06-14 LAB — RETICULOCYTES
Immature Retic Fract: 27.5 % — ABNORMAL HIGH (ref 2.3–15.9)
RBC.: 4.2 MIL/uL (ref 3.87–5.11)
Retic Count, Absolute: 67.2 K/uL (ref 19.0–186.0)
Retic Ct Pct: 1.6 % (ref 0.4–3.1)

## 2022-06-14 LAB — VITAMIN D 25 HYDROXY (VIT D DEFICIENCY, FRACTURES): Vit D, 25-Hydroxy: 81.59 ng/mL (ref 30–100)

## 2022-06-14 LAB — VITAMIN B12: Vitamin B-12: 704 pg/mL (ref 180–914)

## 2022-06-14 NOTE — Progress Notes (Signed)
[facility]   [phone] Fax:([phone]   Clinic New Consult Note   Patient Care Team: Dorothyann Peng, MD as PCP - General (Internal Medicine)  Date of Service:  06/14/2022   CHIEF COMPLAINTS/PURPOSE OF CONSULTATION:  Chronic microcytic anemia  REFERRING PHYSICIAN:  Nicholaus Bloom, MD  HISTORY OF PRESENTING ILLNESS:  Adrienne Benjamin 42 y.o. female is a here because of anemia. The patient was referred by Nicholaus Bloom ,MD. The patient presents to the clinic today alone.  Pt state that she has been anemic since she was in her teenager. Pt has been taking oral iron for many years, she is currently on ferrous sulfate 325 mg once daily, with mild constipation, she is compliant.  However she feels the oral iron is not working for her anymore, she complains of moderate fatigue, especially in the past few years, she is still able to function normally at home and at work, but does feel exhausted and take naps after work.  She denies any chest pain or dyspnea.  She has regular menstrual period, it last about 3 to 7 days, heavy in the first 2 to 3 days with changing pad every 2-3 hours.  She has never been pregnant, she has seen fertility specialist, and is trying to be pregnant.  She is obese with body weight is 320 pounds, has hypertension and insulin resistance.   She has a PMHx of.... Mom Anemic Sister Anemic  Socially... Single No children   REVIEW OF SYSTEMS:   Constitutional: (-)Denies fevers, chills or abnormal night sweats Eyes: (-) Denies blurriness of vision, double vision or watery eyes Ears, nose, mouth, throat, and face: Denies mucositis or sore throat Respiratory: (-) Denies cough, dyspnea or wheezes Cardiovascular: (-)Denies palpitation, chest discomfort or lower extremity swelling Gastrointestinal: (-) Denies nausea, heartburn or change in bowel habits Skin: (-)Denies abnormal skin rashes Lymphatics:(-)  Denies new lymphadenopathy or easy  bruising Neurological:(-) Denies numbness, tingling or new weaknesses Behavioral/Psych: (-)Mood is stable, no new changes  All other systems were reviewed with the patient and are negative.   MEDICAL HISTORY:  Past Medical History:  Diagnosis Date   Abnormal Pap smear 3/11   ascus/ hpv   Anemia    Complex ovarian cyst    Endometriosis 3/07   Heavy menstrual bleeding    Hypertension    Insulin resistance    last few years   Insulin resistance     SURGICAL HISTORY: Past Surgical History:  Procedure Laterality Date   HYSTEROSCOPY WITH D & C N/A 11/21/2019   Procedure: DILATATION AND CURETTAGE /HYSTEROSCOPY;  Surgeon: Jaymes Graff, MD;  Location: [facility];  Service: Gynecology;  Laterality: N/A;  removal of synechiae   impacted wisdom teeth  2006   OVARIAN CYST REMOVAL  2005    SOCIAL HISTORY: Social History   Socioeconomic History   Marital status: Single    Spouse name: Not on file   Number of children: 0   Years of education: Not on file   Highest education level: Not on file  Occupational History   Not on file  Tobacco Use   Smoking status: Never   Smokeless tobacco: Never  Vaping Use   Vaping Use: Never used  Substance and Sexual Activity   Alcohol use: No   Drug use: No   Sexual activity: Yes    Birth control/protection: Pill  Other Topics Concern   Not on file  Social History Narrative   Not on file  Social Determinants of Health   Financial Resource Strain: Not on file  Food Insecurity: Not on file  Transportation Needs: Not on file  Physical Activity: Not on file  Stress: Not on file  Social Connections: Not on file  Intimate Partner Violence: Not on file    FAMILY HISTORY: Family History  Problem Relation Age of Onset   Hypertension Mother    Anemia Mother    Diabetes Father    Anemia Sister     ALLERGIES:  is allergic to ciprofloxacin.  MEDICATIONS:  Current Outpatient Medications  Medication Sig Dispense  Refill   Ascorbic Acid (VITAMIN C) 100 MG tablet Take 100 mg by mouth daily.     Cholecalciferol (VITAMIN D) 50 MCG (2000 UT) CAPS Take by mouth. daily     co-enzyme Q-10 30 MG capsule Take 30 mg by mouth 3 (three) times daily.     folic acid (FOLVITE) 1 MG tablet Take 1 mg by mouth daily.     hydrochlorothiazide (MICROZIDE) 12.5 MG capsule Take 1 capsule by mouth once daily 90 capsule 0   metFORMIN (GLUCOPHAGE) 500 MG tablet Take 1 tablet (500 mg total) by mouth 3 (three) times daily with meals. 270 tablet 2   Prenatal Vit-Fe Fumarate-FA (PRENATAL MULTIVITAMIN) TABS Take 1 tablet by mouth daily.     vitamin B-12 (CYANOCOBALAMIN) 100 MCG tablet Take 100 mcg by mouth daily.     No current facility-administered medications for this visit.    PHYSICAL EXAMINATION: ECOG PERFORMANCE STATUS: 1 - Symptomatic but completely ambulatory  Vitals:   06/14/22 1501  BP: 134/89  Pulse: 76  Resp: 18  Temp: 98.4 F (36.9 C)  SpO2: 100%   Filed Weights   06/14/22 1501  Weight: (!) 320 lb 14.4 oz (145.6 kg)    GENERAL:alert, no distress and comfortable SKIN: skin color normal, no rashes or significant lesions EYES: normal, Conjunctiva are pink and non-injected, sclera clear  NEURO: alert & oriented x 3 with fluent speech NECK: (-)supple, thyroid normal size, non-tender, without nodularity LYMPH:  (-) no palpable lymphadenopathy in the cervical, axillary  LUNGS: (-) clear to auscultation and percussion with normal breathing effort HEART: (-) regular rate & rhythm and no murmurs and no lower extremity edema ABDOMEN:(-) abdomen soft, (-) non-tender and (-) normal bowel sounds   LABORATORY DATA:  I have reviewed the data as listed    Latest Ref Rng & Units 06/14/2022    3:35 PM 05/09/2022   10:25 AM 11/08/2021   10:50 AM  CBC  WBC 4.0 - 10.5 K/uL 5.1  4.0  3.7   Hemoglobin 12.0 - 15.0 g/dL 9.9  16.1  09.6   Hematocrit 36.0 - 46.0 % 30.0  32.6  34.6   Platelets 150 - 400 K/uL 303  345  282         Latest Ref Rng & Units 05/09/2022   10:25 AM 11/08/2021   10:50 AM 04/12/2021   12:46 PM  CMP  Glucose 70 - 99 mg/dL 88  78  87   BUN 6 - 24 mg/dL 10  10  6    Creatinine 0.57 - 1.00 mg/dL 0.45  4.09  8.11   Sodium 134 - 144 mmol/L 138  139  141   Potassium 3.5 - 5.2 mmol/L 4.1  3.8  3.9   Chloride 96 - 106 mmol/L 100  98  103   CO2 20 - 29 mmol/L 22  23  26    Calcium 8.7 - 10.2  mg/dL 9.5  9.4  9.5   Total Protein 6.0 - 8.5 g/dL 7.2  6.9    Total Bilirubin 0.0 - 1.2 mg/dL 0.5  0.5    Alkaline Phos 44 - 121 IU/L 89  66    AST 0 - 40 IU/L 10  13    ALT 0 - 32 IU/L 8  8       RADIOGRAPHIC STUDIES: I have personally reviewed the radiological images as listed and agreed with the findings in the report. No results found.  ASSESSMENT & PLAN:  KLYNN WEHMEYER is a 42 y.o.  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  I explained to her that this can cause low MCV, but typically people do not have significant anemia, hemoglobin could be slightly low in 11 range.  -She does have menorrhagia, her iron study are within normal limits, but on the low end.  I encouraged her to increase ferrous sulfate from 1 tablet daily to 2 tablets a day, to see if that improves her anemia -I will also check B12, methylmalonic acid, and folate to rule out other nutritional anemia.  I will also check reticulocyte count, heavy metal panel as part of her anemia workup. -Given her young age and chronic mild anemia, the possibility of MDS or other malignant hematological disorders are very low, I do not think she needs a bone marrow biopsy.    PLAN:  -Lab today for B12 , folate, ret, Ferritin, CBC -She will increase ferrous sulfate from 1  tablet daily to twice daily -lab and f/u phone visit in 6 weeks   Orders Placed This Encounter  Procedures   Heavy metals, blood    Standing Status:   Future    Number of Occurrences:   1    Standing Expiration Date:   06/14/2023    Order Specific Question:   [place] of residence?    Answer:   [place] [[id number]]   Hgb Fractionation [place]    Standing Status:   Future    Number of Occurrences:   1    Standing Expiration Date:   06/14/2023   Methylmalonic acid, serum    Standing Status:   Future    Number of Occurrences:   1    Standing Expiration Date:   06/14/2023   Reticulocytes    Standing Status:   Future    Number of Occurrences:   1    Standing Expiration Date:   06/14/2023   Vitamin B12    Standing Status:   Future    Number of Occurrences:   1    Standing Expiration Date:   06/14/2023   Folate RBC    Standing Status:   Future    Number of Occurrences:   1    Standing Expiration Date:   06/14/2023   CBC with Differential (Cancer Center Only)    Standing Status:   Standing    Number of Occurrences:   30    Standing Expiration Date:   06/14/2023   Ferritin    Standing Status:   Standing    Number of Occurrences:   30    Standing Expiration Date:   06/14/2023   Vitamin D 25 hydroxy    Standing  Status:   Future    Number of Occurrences:   1    Standing Expiration Date:   06/14/2023    All questions were answered. The patient knows to call the clinic with any problems, questions or concerns. The total time spent in the appointment was 30 minutes.     Malachy Mood, MD 06/14/2022 5:08 PM  I, Monica Martinez, am acting as scribe for Malachy Mood, MD.   I have reviewed the above documentation for accuracy and completeness, and I agree with the above.

## 2022-06-15 ENCOUNTER — Telehealth: Payer: Self-pay | Admitting: Hematology

## 2022-06-15 LAB — FOLATE RBC
Folate, Hemolysate: 435 ng/mL
Folate, RBC: 1403 ng/mL (ref >498)
Hematocrit: 31 % — ABNORMAL LOW (ref 34.0–46.6)

## 2022-06-15 LAB — FERRITIN: Ferritin: 11 ng/mL (ref 11–307)

## 2022-06-15 NOTE — Telephone Encounter (Signed)
Contacted patient to scheduled appointments. Patient is aware of appointments that are scheduled.   

## 2022-06-16 LAB — HEAVY METALS, BLOOD
Arsenic: 2 ug/L (ref 0–9)
Lead: <1 ug/dL (ref 0.0–3.4)
Mercury: <1 ug/L (ref 0.0–14.9)

## 2022-06-18 LAB — METHYLMALONIC ACID, SERUM: Methylmalonic Acid, Quantitative: 106 nmol/L (ref 0–378)

## 2022-06-19 LAB — HGB FRACTIONATION CASCADE

## 2022-06-19 LAB — HGB FRACTIONATION BY HPLC
Hgb A2: 3.2 % (ref 1.8–3.2)
Hgb A: 70.2 % — ABNORMAL LOW (ref 96.4–98.8)
Hgb C: 26.6 % — ABNORMAL HIGH
Hgb E: 0 %
Hgb F: 0 % (ref 0.0–2.0)
Hgb S: 0 %
Hgb Variant: 0 %

## 2022-07-27 ENCOUNTER — Other Ambulatory Visit: Payer: Self-pay

## 2022-07-27 ENCOUNTER — Other Ambulatory Visit: Payer: Self-pay | Admitting: Internal Medicine

## 2022-07-27 ENCOUNTER — Inpatient Hospital Stay: Payer: BC Managed Care – PPO | Attending: Hematology

## 2022-07-27 DIAGNOSIS — N920 Excessive and frequent menstruation with regular cycle: Secondary | ICD-10-CM | POA: Insufficient documentation

## 2022-07-27 DIAGNOSIS — D500 Iron deficiency anemia secondary to blood loss (chronic): Secondary | ICD-10-CM | POA: Diagnosis not present

## 2022-07-27 DIAGNOSIS — D563 Thalassemia minor: Secondary | ICD-10-CM | POA: Insufficient documentation

## 2022-07-27 DIAGNOSIS — D649 Anemia, unspecified: Secondary | ICD-10-CM

## 2022-07-27 LAB — CBC WITH DIFFERENTIAL (CANCER CENTER ONLY)
Abs Immature Granulocytes: 0 10*3/uL (ref 0.00–0.07)
Basophils Absolute: 0 10*3/uL (ref 0.0–0.1)
Basophils Relative: 0 %
Eosinophils Absolute: 0.1 10*3/uL (ref 0.0–0.5)
Eosinophils Relative: 1 %
HCT: 32.8 % — ABNORMAL LOW (ref 36.0–46.0)
Hemoglobin: 11.2 g/dL — ABNORMAL LOW (ref 12.0–15.0)
Immature Granulocytes: 0 %
Lymphocytes Relative: 54 %
Lymphs Abs: 2.4 10*3/uL (ref 0.7–4.0)
MCH: 23.9 pg — ABNORMAL LOW (ref 26.0–34.0)
MCHC: 34.1 g/dL (ref 30.0–36.0)
MCV: 70.1 fL — ABNORMAL LOW (ref 80.0–100.0)
Monocytes Absolute: 0.3 10*3/uL (ref 0.1–1.0)
Monocytes Relative: 6 %
Neutro Abs: 1.8 10*3/uL (ref 1.7–7.7)
Neutrophils Relative %: 39 %
Platelet Count: 300 10*3/uL (ref 150–400)
RBC: 4.68 MIL/uL (ref 3.87–5.11)
RDW: 18.4 % — ABNORMAL HIGH (ref 11.5–15.5)
WBC Count: 4.6 10*3/uL (ref 4.0–10.5)
nRBC: 0 % (ref 0.0–0.2)

## 2022-07-30 DIAGNOSIS — D563 Thalassemia minor: Secondary | ICD-10-CM | POA: Insufficient documentation

## 2022-07-30 NOTE — Assessment & Plan Note (Signed)
-  Per patient, she has had chronic anemia since teenager, with persistent microcytosis. 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  I explained to her that this can cause low MCV, but typically people do not have significant anemia, hemoglobin could be slightly low in 11 range -I reviewed her recent lab workup from July 27, 2022.  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  B12 level and folate level were normal, ferritin was 20, on the morning of normal, heavy metal panel was negative. -I recommended her to take over-the-counter iron pill once a day giving borderline low ferritin, and her heavy menstrual period.  -Continue monitoring.

## 2022-07-31 ENCOUNTER — Encounter: Payer: Self-pay | Admitting: Hematology

## 2022-07-31 ENCOUNTER — Inpatient Hospital Stay (HOSPITAL_BASED_OUTPATIENT_CLINIC_OR_DEPARTMENT_OTHER): Payer: BC Managed Care – PPO | Admitting: Hematology

## 2022-07-31 DIAGNOSIS — D500 Iron deficiency anemia secondary to blood loss (chronic): Secondary | ICD-10-CM | POA: Diagnosis not present

## 2022-07-31 DIAGNOSIS — D563 Thalassemia minor: Secondary | ICD-10-CM | POA: Diagnosis not present

## 2022-07-31 NOTE — Progress Notes (Signed)
[facility]   Telephone:(336) [phone] Fax:(336) [phone]   Clinic Follow up Note   Patient Care Team: Dorothyann Peng, MD as PCP - General (Internal Medicine)  Date of Service:  07/31/2022  I connected with Adrienne Benjamin on 07/31/2022 at  2:40 PM EDT by telephone visit and verified that I am speaking with the correct person using two identifiers.  I discussed the limitations, risks, security and privacy concerns of performing an evaluation and management service by telephone and the availability of in person appointments. I also discussed with the patient that there may be a patient responsible charge related to this service. The patient expressed understanding and agreed to proceed.   Other persons participating in the visit and their role in the encounter:  No  Patient's location:  Home Provider's location:  [facility] Office  CHIEF COMPLAINT: f/u of Chronic microcytic anemia   CURRENT THERAPY:  ferrous sulfate 2 tablets daily  ASSESSMENT & PLAN:  Adrienne Benjamin is a 42 y.o. female with   Alpha thalassemia trait and hemoglobin C trait -Per patient, she has had chronic anemia since teenager, with persistent microcytosis. 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  I explained to her that this can cause low MCV, but typically people do not have significant anemia, hemoglobin could be slightly low in 11 range -I reviewed her recent lab workup from July 27, 2022.  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  B12 level and folate level were normal, ferritin was 20, on the morning of normal, heavy metal panel was negative. -I recommended her to take over-the-counter iron pill once a day giving borderline low ferritin, and her heavy menstrual period.  -Continue monitoring.  Iron deficiency anemia due to chronic blood loss -Secondary to menorrhagia -She has been  taking oral iron since her 70s.  She recently increased to 2 tablets daily in June or 2024, and her anemia improved, her ferritin also increased. -She will continue prenatal vitamin and ferrous sulfate to 2 tablets daily  PLAN: -lab reviewed-hg 11.0, anemia improved. I reviewed her lab results from June 2024 also -recommend continue taking prenatal and iron supplement. -CBC/ Ferritin F/u with PCP in 11/2022 -pt to call  if ferritin<20 -Lab in 8 months -Lab and follow-up in 12 months   INTERVAL HISTORY:  Adrienne Benjamin was contacted for a follow up of Chronic microcytic anemia . She was last seen by me on 06/14/2022.  She has been taking oral iron pill 2 tablets a day, and prenatal vitamin daily, she is tolerating well.  No dyspnea or other complaints.   All other systems were reviewed with the patient and are negative.  MEDICAL HISTORY:  Past Medical History:  Diagnosis Date   Abnormal Pap smear 3/11   ascus/ hpv   Anemia    Complex ovarian cyst    Endometriosis 3/07   Heavy menstrual bleeding    Hypertension    Insulin resistance    last few years   Insulin resistance     SURGICAL HISTORY: Past Surgical History:  Procedure Laterality Date   HYSTEROSCOPY WITH D & C N/A 11/21/2019   Procedure: DILATATION AND CURETTAGE /HYSTEROSCOPY;  Surgeon: Jaymes Graff, MD;  Location: [facility];  Service: Gynecology;  Laterality: N/A;  removal of synechiae   impacted wisdom teeth  2006   OVARIAN CYST REMOVAL  2005  I have reviewed the social history and family history with the patient and they are unchanged from previous note.  ALLERGIES:  is allergic to ciprofloxacin.  MEDICATIONS:  Current Outpatient Medications  Medication Sig Dispense Refill   Ascorbic Acid (VITAMIN C) 100 MG tablet Take 100 mg by mouth daily.     Cholecalciferol (VITAMIN D) 50 MCG (2000 UT) CAPS Take by mouth. daily     co-enzyme Q-10 30 MG capsule Take 30 mg by mouth 3 (three) times  daily.     folic acid (FOLVITE) 1 MG tablet Take 1 mg by mouth daily.     hydrochlorothiazide (MICROZIDE) 12.5 MG capsule Take 1 capsule by mouth once daily 90 capsule 0   metFORMIN (GLUCOPHAGE) 500 MG tablet Take 1 tablet (500 mg total) by mouth 3 (three) times daily with meals. 270 tablet 2   Prenatal Vit-Fe Fumarate-FA (PRENATAL MULTIVITAMIN) TABS Take 1 tablet by mouth daily.     vitamin B-12 (CYANOCOBALAMIN) 100 MCG tablet Take 100 mcg by mouth daily.     No current facility-administered medications for this visit.    PHYSICAL EXAMINATION: ECOG PERFORMANCE STATUS: 0 - Asymptomatic  There were no vitals filed for this visit. Wt Readings from Last 3 Encounters:  06/14/22 (!) 320 lb 14.4 oz (145.6 kg)  05/09/22 (!) 310 lb (140.6 kg)  11/08/21 276 lb 3.2 oz (125.3 kg)     No vitals taken today, Exam not performed today  LABORATORY DATA:  I have reviewed the data as listed    Latest Ref Rng & Units 07/27/2022    2:57 PM 06/14/2022    3:35 PM 05/09/2022   10:25 AM  CBC  WBC 4.0 - 10.5 K/uL 4.6  5.1  4.0   Hemoglobin 12.0 - 15.0 g/dL 40.9  9.9  81.1   Hematocrit 36.0 - 46.0 % 32.8  30.0    31.0  32.6   Platelets 150 - 400 K/uL 300  303  345         Latest Ref Rng & Units 05/09/2022   10:25 AM 11/08/2021   10:50 AM 04/12/2021   12:46 PM  CMP  Glucose 70 - 99 mg/dL 88  78  87   BUN 6 - 24 mg/dL 10  10  6    Creatinine 0.57 - 1.00 mg/dL 9.14  7.82  9.56   Sodium 134 - 144 mmol/L 138  139  141   Potassium 3.5 - 5.2 mmol/L 4.1  3.8  3.9   Chloride 96 - 106 mmol/L 100  98  103   CO2 20 - 29 mmol/L 22  23  26    Calcium 8.7 - 10.2 mg/dL 9.5  9.4  9.5   Total Protein 6.0 - 8.5 g/dL 7.2  6.9    Total Bilirubin 0.0 - 1.2 mg/dL 0.5  0.5    Alkaline Phos 44 - 121 IU/L 89  66    AST 0 - 40 IU/L 10  13    ALT 0 - 32 IU/L 8  8        RADIOGRAPHIC STUDIES: I have personally reviewed the radiological images as listed and agreed with the findings in the report. No results found.     No orders of the defined types were placed in this encounter.  All questions were answered. The patient knows to call the clinic with any problems, questions or concerns. No barriers to learning was detected. The total time spent in the appointment was 15 minutes.  Malachy Mood, MD 07/31/2022   Carolin Coy am acting as scribe for Malachy Mood, MD.   I have reviewed the above documentation for accuracy and completeness, and I agree with the above.

## 2022-07-31 NOTE — Assessment & Plan Note (Signed)
-  Secondary to menorrhagia -She has been taking oral iron since her 58s.  She recently increased to 2 tablets daily in June or 2024, and her anemia improved, her ferritin also increased. -She will continue prenatal vitamin and ferrous sulfate to 2 tablets daily

## 2022-10-24 ENCOUNTER — Other Ambulatory Visit: Payer: Self-pay | Admitting: Internal Medicine

## 2022-11-14 ENCOUNTER — Ambulatory Visit (INDEPENDENT_AMBULATORY_CARE_PROVIDER_SITE_OTHER): Payer: BC Managed Care – PPO | Admitting: Internal Medicine

## 2022-11-14 ENCOUNTER — Encounter: Payer: Self-pay | Admitting: Internal Medicine

## 2022-11-14 VITALS — BP 132/84 | HR 68 | Temp 98.9°F | Ht 72.0 in | Wt 315.8 lb

## 2022-11-14 DIAGNOSIS — D563 Thalassemia minor: Secondary | ICD-10-CM

## 2022-11-14 DIAGNOSIS — E88819 Insulin resistance, unspecified: Secondary | ICD-10-CM

## 2022-11-14 DIAGNOSIS — I10 Essential (primary) hypertension: Secondary | ICD-10-CM | POA: Diagnosis not present

## 2022-11-14 DIAGNOSIS — Z23 Encounter for immunization: Secondary | ICD-10-CM | POA: Diagnosis not present

## 2022-11-14 DIAGNOSIS — Z0000 Encounter for general adult medical examination without abnormal findings: Secondary | ICD-10-CM | POA: Insufficient documentation

## 2022-11-14 DIAGNOSIS — D500 Iron deficiency anemia secondary to blood loss (chronic): Secondary | ICD-10-CM | POA: Diagnosis not present

## 2022-11-14 DIAGNOSIS — Z6841 Body Mass Index (BMI) 40.0 and over, adult: Secondary | ICD-10-CM

## 2022-11-14 DIAGNOSIS — E66813 Obesity, class 3: Secondary | ICD-10-CM

## 2022-11-14 LAB — POCT URINALYSIS DIPSTICK
Bilirubin, UA: NEGATIVE
Blood, UA: NEGATIVE
Glucose, UA: NEGATIVE
Ketones, UA: NEGATIVE
Nitrite, UA: NEGATIVE
Protein, UA: NEGATIVE
Spec Grav, UA: 1.02 (ref 1.010–1.025)
Urobilinogen, UA: 0.2 U/dL
pH, UA: 7 (ref 5.0–8.0)

## 2022-11-14 NOTE — Assessment & Plan Note (Signed)
Chronic, currently on metformin. I will check labs as below. She is encouraged to follow a low-glycemic diet, free of refined carbs.

## 2022-11-14 NOTE — Progress Notes (Signed)
I,Victoria T Deloria Lair, CMA,acting as a Neurosurgeon for [name], MD.,have documented all relevant documentation on the behalf of [name], MD,as directed by  [name], MD while in the presence of [name], MD.  Subjective:    Patient ID: Adrienne Benjamin , female    DOB: [birth year] , 42 y.o.   MRN: [id number]  Chief Complaint  Patient presents with   Annual Exam   Hypertension    HPI  She is here today for a full physical examination. She has her pap smears performed by Dr. Normand Sloop.  She is scheduled to see her next week.  She reports compliance with meds. She denies headaches, chest pain and shortness of breath.   She would like to discuss weight loss medications. She feels her weight is too much. She admits she has not been exercising on a regular basis.        Hypertension This is a chronic problem. The current episode started more than 1 year ago. The problem has been gradually improving since onset. The problem is controlled. Pertinent negatives include no blurred vision, chest pain or palpitations. Risk factors for coronary artery disease include obesity and sedentary lifestyle. Past treatments include calcium channel blockers and diuretics. The current treatment provides moderate improvement. Compliance problems include exercise.      Past Medical History:  Diagnosis Date   Abnormal Pap smear 3/11   ascus/ hpv   Anemia    Complex ovarian cyst    Endometriosis 3/07   Heavy menstrual bleeding    Hypertension    Insulin resistance    last few years   Insulin resistance      Family History  Problem Relation Age of Onset   Hypertension Mother    Anemia Mother    Diabetes Father    Anemia Sister      Current Outpatient Medications:    Ascorbic Acid (VITAMIN C) 100 MG tablet, Take 100 mg by mouth daily., Disp: , Rfl:    Cholecalciferol (VITAMIN D) 50 MCG (2000 UT) CAPS, Take by mouth. daily, Disp: , Rfl:    co-enzyme Q-10 30 MG capsule, Take 30  mg by mouth 3 (three) times daily., Disp: , Rfl:    folic acid (FOLVITE) 1 MG tablet, Take 1 mg by mouth daily., Disp: , Rfl:    hydrochlorothiazide (MICROZIDE) 12.5 MG capsule, Take 1 capsule by mouth once daily, Disp: 90 capsule, Rfl: 0   metFORMIN (GLUCOPHAGE) 500 MG tablet, Take 1 tablet (500 mg total) by mouth 3 (three) times daily with meals., Disp: 270 tablet, Rfl: 2   Prenatal Vit-Fe Fumarate-FA (PRENATAL MULTIVITAMIN) TABS, Take 1 tablet by mouth daily., Disp: , Rfl:    vitamin B-12 (CYANOCOBALAMIN) 100 MCG tablet, Take 100 mcg by mouth daily., Disp: , Rfl:    Allergies  Allergen Reactions   Ciprofloxacin Nausea And Vomiting      The patient states she uses none for birth control. Patient's last menstrual period was 10/23/2022 (exact date).. Negative for Dysmenorrhea. Negative for: breast discharge, breast lump(s), breast pain and breast self exam. Associated symptoms include abnormal vaginal bleeding. Pertinent negatives include abnormal bleeding (hematology), anxiety, decreased libido, depression, difficulty falling sleep, dyspareunia, history of infertility, nocturia, sexual dysfunction, sleep disturbances, urinary incontinence, urinary urgency, vaginal discharge and vaginal itching. Diet regular.The patient states her exercise level is  minimal; however, states she walks a lot at work. c  . The patient's tobacco use is:  Social History  Tobacco Use  Smoking Status Never  Smokeless Tobacco Never  . She has been exposed to passive smoke. The patient's alcohol use is:  Social History   Substance and Sexual Activity  Alcohol Use No    Review of Systems  Constitutional: Negative.   HENT: Negative.    Eyes: Negative.  Negative for blurred vision.  Respiratory: Negative.    Cardiovascular: Negative.  Negative for chest pain and palpitations.  Gastrointestinal: Negative.   Endocrine: Negative.   Genitourinary: Negative.   Musculoskeletal: Negative.   Skin: Negative.    Allergic/Immunologic: Negative.   Neurological: Negative.   Hematological: Negative.   Psychiatric/Behavioral: Negative.       Today's Vitals   11/14/22 0829  BP: 132/84  Pulse: 68  Temp: 98.9 F (37.2 C)  SpO2: 98%  Weight: (!) 315 lb 12.8 oz (143.2 kg)  Height: 6' (1.829 m)   Body mass index is 42.83 kg/m.  Wt Readings from Last 3 Encounters:  11/14/22 (!) 315 lb 12.8 oz (143.2 kg)  06/14/22 (!) 320 lb 14.4 oz (145.6 kg)  05/09/22 (!) 310 lb (140.6 kg)     Objective:  Physical Exam Vitals and nursing note reviewed.  Constitutional:      Appearance: Normal appearance. She is obese.  HENT:     Head: Normocephalic and atraumatic.     Right Ear: Tympanic membrane, ear canal and external ear normal.     Left Ear: Tympanic membrane, ear canal and external ear normal.     Nose: Nose normal.     Mouth/Throat:     Mouth: Mucous membranes are moist.     Pharynx: Oropharynx is clear.  Eyes:     Extraocular Movements: Extraocular movements intact.     Conjunctiva/sclera: Conjunctivae normal.     Pupils: Pupils are equal, round, and reactive to light.  Cardiovascular:     Rate and Rhythm: Normal rate and regular rhythm.     Pulses:          Dorsalis pedis pulses are 3+ on the right side and 3+ on the left side.     Heart sounds: Normal heart sounds.  Pulmonary:     Effort: Pulmonary effort is normal.     Breath sounds: Normal breath sounds.  Chest:  Breasts:    Tanner Score is 5.     Right: Normal.     Left: Normal.  Abdominal:     General: Bowel sounds are normal. There is no distension.     Palpations: Abdomen is soft.     Tenderness: There is no abdominal tenderness.     Comments: Rounded, soft.   Genitourinary:    Comments: deferred Musculoskeletal:        General: Normal range of motion.     Cervical back: Normal range of motion and neck supple.  Skin:    General: Skin is warm and dry.  Neurological:     General: No focal deficit present.     Mental  Status: She is alert and oriented to person, place, and time.  Psychiatric:        Mood and Affect: Mood normal.        Behavior: Behavior normal.         Assessment And Plan:     Annual physical exam Assessment & Plan: A full exam was performed.  Importance of monthly self breast exams was discussed with the patient.  She is advised to get 30-45 minutes of regular exercise, no less than  four to five days per week. Both weight-bearing and aerobic exercises are recommended.  She is advised to follow a healthy diet with at least six fruits/veggies per day, decrease intake of red meat and other saturated fats and to increase fish intake to twice weekly.  Meats/fish should not be fried -- baked, boiled or broiled is preferable. It is also important to cut back on your sugar intake.  Be sure to read labels - try to avoid anything with added sugar, high fructose corn syrup or other sweeteners.  If you must use a sweetener, you can try stevia or monkfruit.  It is also important to avoid artificially sweetened foods/beverages and diet drinks. Lastly, wear SPF 50 sunscreen on exposed skin and when in direct sunlight for an extended period of time.  Be sure to avoid fast food restaurants and aim for at least 60 ounces of water daily.      Orders: -     CBC -     CMP14+EGFR -     Lipid panel -     Hemoglobin A1c  Essential hypertension, benign Assessment & Plan: Chronic, fair control. Goal BP<130/80.  EKG performed, NSR w/o acute changes. She will continue with hydrochlorothiazide 12.5mg  daily. Encouraged to incorporate more exercise into her daily routine. She is advised to follow a low sodium diet. She will f/u in six months for re-evaluation.   Orders: -     POCT urinalysis dipstick -     Microalbumin / creatinine urine ratio -     EKG 12-Lead  Insulin resistance Assessment & Plan: Chronic, currently on metformin. I will check labs as below. She is encouraged to follow a low-glycemic diet,  free of refined carbs.   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 Most recent notes reviewed. She is encouraged to keep all f/u appointments.    Iron deficiency anemia due to chronic blood loss -     Iron, TIBC and Ferritin Panel  Class 3 severe obesity due to excess calories with serious comorbidity and body mass index (BMI) of 40.0 to 44.9 in adult [facility]) Assessment & Plan: BMI 42. She was given information on community weight loss clinics. She is encouraged to incorporate more exercise into her daily routine, aiming for at least 150 minutes of exercise per week. We also discussed the importance of having adequate protein in her diet and need for resistance training.    Immunization due -     Flu vaccine trivalent PF, 6mos and older(Flulaval,Afluria,Fluarix,Fluzone)  She is encouraged to strive for BMI less than 30 to decrease cardiac risk. Advised to aim for at least 150 minutes of exercise per week.    Return for 1 year physical, 6 month bp. Patient was given opportunity to ask questions. Patient verbalized understanding of the plan and was able to repeat key elements of the plan. All questions were answered to their satisfaction.    I, [name], MD, have reviewed all documentation for this visit. The documentation on 11/14/22 for the exam, diagnosis, procedures, and orders are all accurate and complete.

## 2022-11-14 NOTE — Assessment & Plan Note (Signed)
BMI 42. She was given information on community weight loss clinics. She is encouraged to incorporate more exercise into her daily routine, aiming for at least 150 minutes of exercise per week. We also discussed the importance of having adequate protein in her diet and need for resistance training.

## 2022-11-14 NOTE — Patient Instructions (Addendum)
[employer] and [employer], Female Adopting a healthy lifestyle and getting preventive care are important in promoting health and wellness. Ask your health care provider about: The right schedule for you to have regular tests and exams. Things you can do on your own to prevent diseases and keep yourself healthy. What should I know about diet, weight, and exercise? Eat a healthy diet  Eat a diet that includes plenty of vegetables, fruits, low-fat dairy products, and lean protein. Do not eat a lot of foods that are high in solid fats, added sugars, or sodium. Maintain a healthy weight Body mass index (BMI) is used to identify weight problems. It estimates body fat based on height and weight. Your health care provider can help determine your BMI and help you achieve or maintain a healthy weight. Get regular exercise Get regular exercise. This is one of the most important things you can do for your health. Most adults should: Exercise for at least 150 minutes each week. The exercise should increase your heart rate and make you sweat (moderate-intensity exercise). Do strengthening exercises at least twice a week. This is in addition to the moderate-intensity exercise. Spend less time sitting. Even light physical activity can be beneficial. Watch cholesterol and blood lipids Have your blood tested for lipids and cholesterol at 41 years of age, then have this test every 5 years. Have your cholesterol levels checked more often if: Your lipid or cholesterol levels are high. You are older than 41 years of age. You are at high risk for heart disease. What should I know about cancer screening? Depending on your health history and family history, you may need to have cancer screening at various ages. This may include screening for: Breast cancer. Cervical cancer. Colorectal cancer. Skin cancer. Lung cancer. What should I know about heart disease, diabetes, and high  blood pressure? Blood pressure and heart disease High blood pressure causes heart disease and increases the risk of stroke. This is more likely to develop in people who have high blood pressure readings or are overweight. Have your blood pressure checked: Every 3-5 years if you are 41-79 years of age. Every year if you are 41 years old or older. Diabetes Have regular diabetes screenings. This checks your fasting blood sugar level. Have the screening done: Once every three years after age 66 if you are at a normal weight and have a low risk for diabetes. More often and at a younger age if you are overweight or have a high risk for diabetes. What should I know about preventing infection? Hepatitis B If you have a higher risk for hepatitis B, you should be screened for this virus. Talk with your health care provider to find out if you are at risk for hepatitis B infection. Hepatitis C Testing is recommended for: Everyone born from [birth year] through 1965. Anyone with known risk factors for hepatitis C. Sexually transmitted infections (STIs) Get screened for STIs, including gonorrhea and chlamydia, if: You are sexually active and are younger than 41 years of age. You are older than 41 years of age and your health care provider tells you that you are at risk for this type of infection. Your sexual activity has changed since you were last screened, and you are at increased risk for chlamydia or gonorrhea. Ask your health care provider if you are at risk. Ask your health care provider about whether you are at high risk for HIV. Your health care provider  may recommend a prescription medicine to help prevent HIV infection. If you choose to take medicine to prevent HIV, you should first get tested for HIV. You should then be tested every 3 months for as long as you are taking the medicine. Pregnancy If you are about to stop having your period (premenopausal) and you may become pregnant, seek counseling  before you get pregnant. Take 400 to 800 micrograms (mcg) of folic acid every day if you become pregnant. Ask for birth control (contraception) if you want to prevent pregnancy. Osteoporosis and menopause Osteoporosis is a disease in which the bones lose minerals and strength with aging. This can result in bone fractures. If you are 12 years old or older, or if you are at risk for osteoporosis and fractures, ask your health care provider if you should: Be screened for bone loss. Take a calcium or vitamin D supplement to lower your risk of fractures. Be given hormone replacement therapy (HRT) to treat symptoms of menopause. Follow these instructions at home: Alcohol use Do not drink alcohol if: Your health care provider tells you not to drink. You are pregnant, may be pregnant, or are planning to become pregnant. If you drink alcohol: Limit how much you have to: 0-1 drink a day. Know how much alcohol is in your drink. In the [place] one drink equals one 12 oz bottle of beer (355 mL), one 5 oz glass of wine (148 mL), or one 1 oz glass of hard liquor (44 mL). Lifestyle Do not use any products that contain nicotine or tobacco. These products include cigarettes, chewing tobacco, and vaping devices, such as e-cigarettes. If you need help quitting, ask your health care provider. Do not use street drugs. Do not share needles. Ask your health care provider for help if you need support or information about quitting drugs. General instructions Schedule regular health, dental, and eye exams. Stay current with your vaccines. Tell your health care provider if: You often feel depressed. You have ever been abused or do not feel safe at home. Summary Adopting a healthy lifestyle and getting preventive care are important in promoting health and wellness. Follow your health care provider's instructions about healthy diet, exercising, and getting tested or screened for diseases. Follow your health care  provider's instructions on monitoring your cholesterol and blood pressure. This information is not intended to replace advice given to you by your health care provider. Make sure you discuss any questions you have with your health care provider. Document Revised: 05/10/2020 Document Reviewed: 05/10/2020 Elsevier Patient Education  2024 [employer].

## 2022-11-14 NOTE — Assessment & Plan Note (Signed)
Chronic, Hematology input appreciated. Most recent notes reviewed. She is encouraged to keep all f/u appointments.

## 2022-11-14 NOTE — Assessment & Plan Note (Signed)

## 2022-11-14 NOTE — Assessment & Plan Note (Signed)
Chronic, fair control. Goal BP<130/80.  EKG performed, NSR w/o acute changes. She will continue with hydrochlorothiazide 12.5mg  daily. Encouraged to incorporate more exercise into her daily routine. She is advised to follow a low sodium diet. She will f/u in six months for re-evaluation.

## 2022-11-15 LAB — CMP14+EGFR
ALT: 7 [IU]/L (ref 0–32)
AST: 12 [IU]/L (ref 0–40)
Albumin: 4.2 g/dL (ref 3.9–4.9)
Alkaline Phosphatase: 86 [IU]/L (ref 44–121)
BUN/Creatinine Ratio: 17 (ref 9–23)
BUN: 12 mg/dL (ref 6–24)
Bilirubin Total: 0.6 mg/dL (ref 0.0–1.2)
CO2: 23 mmol/L (ref 20–29)
Calcium: 9.5 mg/dL (ref 8.7–10.2)
Chloride: 103 mmol/L (ref 96–106)
Creatinine, Ser: 0.7 mg/dL (ref 0.57–1.00)
Globulin, Total: 3 g/dL (ref 1.5–4.5)
Glucose: 86 mg/dL (ref 70–99)
Potassium: 4.3 mmol/L (ref 3.5–5.2)
Sodium: 141 mmol/L (ref 134–144)
Total Protein: 7.2 g/dL (ref 6.0–8.5)
eGFR: 111 mL/min/{1.73_m2} (ref >59)

## 2022-11-15 LAB — LIPID PANEL
Chol/HDL Ratio: 2.4 ratio (ref 0.0–4.4)
Cholesterol, Total: 190 mg/dL (ref 100–199)
HDL: 79 mg/dL (ref >39)
LDL Chol Calc (NIH): 99 mg/dL (ref 0–99)
Triglycerides: 64 mg/dL (ref 0–149)
VLDL Cholesterol Cal: 12 mg/dL (ref 5–40)

## 2022-11-15 LAB — HEMOGLOBIN A1C
Est. average glucose Bld gHb Est-mCnc: 120 mg/dL
Hgb A1c MFr Bld: 5.8 % — ABNORMAL HIGH (ref 4.8–5.6)

## 2022-11-15 LAB — CBC
Hematocrit: 36.2 % (ref 34.0–46.6)
Hemoglobin: 11.3 g/dL (ref 11.1–15.9)
MCH: 23 pg — ABNORMAL LOW (ref 26.6–33.0)
MCHC: 31.2 g/dL — ABNORMAL LOW (ref 31.5–35.7)
MCV: 74 fL — ABNORMAL LOW (ref 79–97)
Platelets: 352 10*3/uL (ref 150–450)
RBC: 4.91 x10E6/uL (ref 3.77–5.28)
RDW: 16.9 % — ABNORMAL HIGH (ref 11.7–15.4)
WBC: 4 10*3/uL (ref 3.4–10.8)

## 2022-11-15 LAB — IRON,TIBC AND FERRITIN PANEL
Ferritin: 35 ng/mL (ref 15–150)
Iron Saturation: 35 % (ref 15–55)
Iron: 122 ug/dL (ref 27–159)
Total Iron Binding Capacity: 351 ug/dL (ref 250–450)
UIBC: 229 ug/dL (ref 131–425)

## 2022-11-15 LAB — MICROALBUMIN / CREATININE URINE RATIO
Creatinine, Urine: 118.3 mg/dL
Microalb/Creat Ratio: 8 mg/g{creat} (ref 0–29)
Microalbumin, Urine: 9.1 ug/mL

## 2022-11-15 LAB — INSULIN, RANDOM: INSULIN: 20.1 u[IU]/mL (ref 2.6–24.9)

## 2023-01-24 ENCOUNTER — Other Ambulatory Visit: Payer: Self-pay | Admitting: Internal Medicine

## 2023-03-29 ENCOUNTER — Inpatient Hospital Stay: Payer: BC Managed Care – PPO | Attending: Internal Medicine

## 2023-04-04 ENCOUNTER — Telehealth: Payer: Self-pay | Admitting: Hematology

## 2023-04-25 ENCOUNTER — Encounter: Payer: Self-pay | Admitting: Internal Medicine

## 2023-05-15 ENCOUNTER — Ambulatory Visit: Payer: BC Managed Care – PPO | Admitting: Internal Medicine

## 2023-05-20 ENCOUNTER — Other Ambulatory Visit: Payer: Self-pay | Admitting: Internal Medicine

## 2023-05-21 ENCOUNTER — Ambulatory Visit: Admitting: Internal Medicine

## 2023-05-21 ENCOUNTER — Encounter: Payer: Self-pay | Admitting: Internal Medicine

## 2023-05-21 VITALS — BP 126/80 | HR 86 | Temp 98.5°F | Ht 72.0 in | Wt 333.4 lb

## 2023-05-21 DIAGNOSIS — E88819 Insulin resistance, unspecified: Secondary | ICD-10-CM

## 2023-05-21 DIAGNOSIS — E66813 Obesity, class 3: Secondary | ICD-10-CM

## 2023-05-21 DIAGNOSIS — I10 Essential (primary) hypertension: Secondary | ICD-10-CM

## 2023-05-21 DIAGNOSIS — D500 Iron deficiency anemia secondary to blood loss (chronic): Secondary | ICD-10-CM

## 2023-05-21 DIAGNOSIS — D563 Thalassemia minor: Secondary | ICD-10-CM | POA: Diagnosis not present

## 2023-05-21 DIAGNOSIS — Z6841 Body Mass Index (BMI) 40.0 and over, adult: Secondary | ICD-10-CM

## 2023-05-21 NOTE — Progress Notes (Signed)
 I,Adrienne Benjamin, CMA,acting as a Neurosurgeon for Adrienne Dung, MD.,have documented all relevant documentation on the behalf of Adrienne Dung, MD,as directed by  Adrienne Dung, MD while in the presence of Adrienne Dung, MD.  Subjective:  Patient ID: Adrienne Benjamin , female    DOB: [birth year] , 43 y.o.   MRN: [id number]  Chief Complaint  Patient presents with   Hypertension    Pt presents for bp check. Pt wants to know if she should continue taking the hydrochlorothiazide  Denies headache, chest pain or sob    HPI Discussed the use of AI scribe software for clinical note transcription with the patient, who gave verbal consent to proceed.  History of Present Illness Adrienne Benjamin is a 43 year old female with hypertension who presents for a blood pressure check.  She has a history of hypertension and has not been taking her blood pressure medication, hydrochlorothiazide , since May 1st due to concerns about its impact on her ability to conceive. She has not been monitoring her blood pressure at home.  She is actively trying to conceive and has not visited Dr. Yalcinkaya in over a year for follow-up regarding her ovulation status. She is considering other specialists for fertility treatment.  She is currently taking metformin  three times a day and folic acid , which she has been on for years.  She has a history of alpha thalassemia and iron deficiency anemia, attributed to her heavy menstrual cycles. She reports regular periods with heavy flow on days two and three. She is still taking iron supplements and her ferritin levels were previously high enough to avoid infusion therapy.  She reports being tired from work, which is a barrier to exercise, but she is considering starting a walking routine.   Hypertension This is a chronic problem. The current episode started more than 1 year ago. The problem has been gradually improving since onset. The problem is controlled. Pertinent  negatives include no blurred vision, chest pain, palpitations or shortness of breath. Risk factors for coronary artery disease include obesity. The current treatment provides moderate improvement.     Past Medical History:  Diagnosis Date   Abnormal Pap smear 3/11   ascus/ hpv   Anemia    Complex ovarian cyst    Endometriosis 3/07   Heavy menstrual bleeding    Hypertension    Insulin  resistance    last few years   Insulin  resistance      Family History  Problem Relation Age of Onset   Hypertension Mother    Anemia Mother    Diabetes Father    Anemia Sister      Current Outpatient Medications:    Ascorbic Acid (VITAMIN C) 100 MG tablet, Take 100 mg by mouth daily., Disp: , Rfl:    Cholecalciferol (VITAMIN D ) 50 MCG (2000 UT) CAPS, Take by mouth. daily, Disp: , Rfl:    co-enzyme Q-10 30 MG capsule, Take 30 mg by mouth 3 (three) times daily., Disp: , Rfl:    folic acid  (FOLVITE ) 1 MG tablet, Take 1 mg by mouth daily., Disp: , Rfl:    metFORMIN  (GLUCOPHAGE ) 500 MG tablet, TAKE 1 TABLET BY MOUTH THREE TIMES DAILY WITH MEALS, Disp: 270 tablet, Rfl: 0   Prenatal Vit-Fe Fumarate-FA (PRENATAL MULTIVITAMIN) TABS, Take 1 tablet by mouth daily., Disp: , Rfl:    vitamin B-12 (CYANOCOBALAMIN ) 100 MCG tablet, Take 100 mcg by mouth daily., Disp: , Rfl:    Allergies  Allergen Reactions  Ciprofloxacin Nausea And Vomiting     Review of Systems  Constitutional: Negative.   Eyes:  Negative for blurred vision.  Respiratory: Negative.  Negative for shortness of breath.   Cardiovascular: Negative.  Negative for chest pain and palpitations.  Gastrointestinal: Negative.   Neurological: Negative.   Psychiatric/Behavioral: Negative.       Today's Vitals   05/21/23 1520  BP: 126/80  Pulse: 86  Temp: 98.5 F (36.9 C)  SpO2: 98%  Weight: (!) 333 lb 6.4 oz (151.2 kg)  Height: 6' (1.829 m)   Body mass index is 45.22 kg/m.  Wt Readings from Last 3 Encounters:  05/21/23 (!) 333 lb 6.4 oz  (151.2 kg)  11/14/22 (!) 315 lb 12.8 oz (143.2 kg)  06/14/22 (!) 320 lb 14.4 oz (145.6 kg)     Objective:  Physical Exam Vitals and nursing note reviewed.  Constitutional:      Appearance: Normal appearance. She is obese.  HENT:     Head: Normocephalic and atraumatic.  Eyes:     Extraocular Movements: Extraocular movements intact.  Cardiovascular:     Rate and Rhythm: Normal rate and regular rhythm.     Heart sounds: Normal heart sounds.  Pulmonary:     Effort: Pulmonary effort is normal.     Breath sounds: Normal breath sounds.  Musculoskeletal:     Cervical back: Normal range of motion.  Skin:    General: Skin is warm.  Neurological:     General: No focal deficit present.     Mental Status: She is alert.  Psychiatric:        Mood and Affect: Mood normal.        Behavior: Behavior normal.         Assessment And Plan:  Essential hypertension, benign Assessment & Plan: Blood pressure within acceptable range post-hydrochlorothiazide  self discontinuation. Alternative antihypertensive options considered for pregnancy safety. - Discontinue hydrochlorothiazide  per patient - Advise regular home blood pressure monitoring. - Instruct to report if blood pressure consistently exceeds 130/80 mmHg. - Consider alternative antihypertensive medication if needed.  Orders: -     CMP14+EGFR  Insulin  resistance Assessment & Plan: Chronic, currently on metformin . I will check labs as below. She is encouraged to follow a low-glycemic diet, free of refined carbs.   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 Most recent notes reviewed. She is encouraged to keep all f/u appointments.    Iron deficiency anemia due to chronic blood loss Assessment & Plan: Chronic, managed with iron supplements. Adequate ferritin levels per hematology consultation. - Continue iron supplementation for now, may  stop once results are available. - Monitor ferritin levels regularly  Orders: -     CBC -     Iron, TIBC and Ferritin Panel  Class 3 severe obesity due to excess calories with body mass index (BMI) of 45.0 to 49.9 in adult Assessment & Plan: Chronic, she has gained 18lbs since November 2024. She is encouraged to resume a regular exercise routine. Advised to aim for at least 150 minutes of exercise per week.      Return if symptoms worsen or fail to improve.  Patient was given opportunity to ask questions. Patient verbalized understanding of the plan and was able to repeat key elements of the plan. All questions were answered to their satisfaction.    I, Adrienne Dung, MD, have reviewed all documentation for this visit.  The documentation on 05/21/23 for the exam, diagnosis, procedures, and orders are all accurate and complete.   IF YOU HAVE BEEN REFERRED TO A SPECIALIST, IT MAY TAKE 1-2 WEEKS TO SCHEDULE/PROCESS THE REFERRAL. IF YOU HAVE NOT HEARD FROM US /SPECIALIST IN TWO WEEKS, PLEASE GIVE US  A CALL AT [phone] X [phone].   THE PATIENT IS ENCOURAGED TO PRACTICE SOCIAL DISTANCING DUE TO THE COVID-19 PANDEMIC.

## 2023-05-21 NOTE — Patient Instructions (Signed)
 Hypertension, Adult Hypertension is another name for high blood pressure. High blood pressure forces your heart to work harder to pump blood. This can cause problems over time. There are two numbers in a blood pressure reading. There is a top number (systolic) over a bottom number (diastolic). It is best to have a blood pressure that is below 120/80. What are the causes? The cause of this condition is not known. Some other conditions can lead to high blood pressure. What increases the risk? Some lifestyle factors can make you more likely to develop high blood pressure: Smoking. Not getting enough exercise or physical activity. Being overweight. Having too much fat, sugar, calories, or salt (sodium) in your diet. Drinking too much alcohol. Other risk factors include: Having any of these conditions: Heart disease. Diabetes. High cholesterol. Kidney disease. Obstructive sleep apnea. Having a family history of high blood pressure and high cholesterol. Age. The risk increases with age. Stress. What are the signs or symptoms? High blood pressure may not cause symptoms. Very high blood pressure (hypertensive crisis) may cause: Headache. Fast or uneven heartbeats (palpitations). Shortness of breath. Nosebleed. Vomiting or feeling like you may vomit (nauseous). Changes in how you see. Very bad chest pain. Feeling dizzy. Seizures. How is this treated? This condition is treated by making healthy lifestyle changes, such as: Eating healthy foods. Exercising more. Drinking less alcohol. Your doctor may prescribe medicine if lifestyle changes do not help enough and if: Your top number is above 130. Your bottom number is above 80. Your personal target blood pressure may vary. Follow these instructions at home: Eating and drinking  If told, follow the DASH eating plan. To follow this plan: Fill one half of your plate at each meal with fruits and vegetables. Fill one fourth of your plate  at each meal with whole grains. Whole grains include whole-wheat pasta, brown rice, and whole-grain bread. Eat or drink low-fat dairy products, such as skim milk or low-fat yogurt. Fill one fourth of your plate at each meal with low-fat (lean) proteins. Low-fat proteins include fish, chicken without skin, eggs, beans, and tofu. Avoid fatty meat, cured and processed meat, or chicken with skin. Avoid pre-made or processed food. Limit the amount of salt in your diet to less than 1,500 mg each day. Do not drink alcohol if: Your doctor tells you not to drink. You are pregnant, may be pregnant, or are planning to become pregnant. If you drink alcohol: Limit how much you have to: 0-1 drink a day for women. 0-2 drinks a day for men. Know how much alcohol is in your drink. In the [place] one drink equals one 12 oz bottle of beer (355 mL), one 5 oz glass of wine (148 mL), or one 1 oz glass of hard liquor (44 mL). Lifestyle  Work with your doctor to stay at a healthy weight or to lose weight. Ask your doctor what the best weight is for you. Get at least 30 minutes of exercise that causes your heart to beat faster (aerobic exercise) most days of the week. This may include walking, swimming, or biking. Get at least 30 minutes of exercise that strengthens your muscles (resistance exercise) at least 3 days a week. This may include lifting weights or doing Pilates. Do not smoke or use any products that contain nicotine or tobacco. If you need help quitting, ask your doctor. Check your blood pressure at home as told by your doctor. Keep all follow-up visits. Medicines Take over-the-counter and prescription medicines  only as told by your doctor. Follow directions carefully. Do not skip doses of blood pressure medicine. The medicine does not work as well if you skip doses. Skipping doses also puts you at risk for problems. Ask your doctor about side effects or reactions to medicines that you should watch  for. Contact a doctor if: You think you are having a reaction to the medicine you are taking. You have headaches that keep coming back. You feel dizzy. You have swelling in your ankles. You have trouble with your vision. Get help right away if: You get a very bad headache. You start to feel mixed up (confused). You feel weak or numb. You feel faint. You have very bad pain in your: Chest. Belly (abdomen). You vomit more than once. You have trouble breathing. These symptoms may be an emergency. Get help right away. Call 911. Do not wait to see if the symptoms will go away. Do not drive yourself to the hospital. Summary Hypertension is another name for high blood pressure. High blood pressure forces your heart to work harder to pump blood. For most people, a normal blood pressure is less than 120/80. Making healthy choices can help lower blood pressure. If your blood pressure does not get lower with healthy choices, you may need to take medicine. This information is not intended to replace advice given to you by your health care provider. Make sure you discuss any questions you have with your health care provider. Document Revised: 10/07/2020 Document Reviewed: 10/07/2020 Elsevier Patient Education  2024 [employer].

## 2023-05-22 LAB — CMP14+EGFR
ALT: 9 IU/L (ref 0–32)
AST: 17 IU/L (ref 0–40)
Albumin: 4.2 g/dL (ref 3.9–4.9)
Alkaline Phosphatase: 83 IU/L (ref 44–121)
BUN/Creatinine Ratio: 11 (ref 9–23)
BUN: 7 mg/dL (ref 6–24)
Bilirubin Total: 0.4 mg/dL (ref 0.0–1.2)
CO2: 22 mmol/L (ref 20–29)
Calcium: 9.7 mg/dL (ref 8.7–10.2)
Chloride: 102 mmol/L (ref 96–106)
Creatinine, Ser: 0.63 mg/dL (ref 0.57–1.00)
Globulin, Total: 2.7 g/dL (ref 1.5–4.5)
Glucose: 83 mg/dL (ref 70–99)
Potassium: 4.3 mmol/L (ref 3.5–5.2)
Sodium: 141 mmol/L (ref 134–144)
Total Protein: 6.9 g/dL (ref 6.0–8.5)
eGFR: 114 mL/min/1.73

## 2023-05-22 LAB — CBC
Hematocrit: 33.1 % — ABNORMAL LOW (ref 34.0–46.6)
Hemoglobin: 10.2 g/dL — ABNORMAL LOW (ref 11.1–15.9)
MCH: 23 pg — ABNORMAL LOW (ref 26.6–33.0)
MCHC: 30.8 g/dL — ABNORMAL LOW (ref 31.5–35.7)
MCV: 75 fL — ABNORMAL LOW (ref 79–97)
Platelets: 371 x10E3/uL (ref 150–450)
RBC: 4.43 x10E6/uL (ref 3.77–5.28)
RDW: 16.7 % — ABNORMAL HIGH (ref 11.7–15.4)
WBC: 4.5 x10E3/uL (ref 3.4–10.8)

## 2023-05-22 LAB — IRON,TIBC AND FERRITIN PANEL
Ferritin: 35 ng/mL (ref 15–150)
Iron Saturation: 20 % (ref 15–55)
Iron: 60 ug/dL (ref 27–159)
Total Iron Binding Capacity: 306 ug/dL (ref 250–450)
UIBC: 246 ug/dL (ref 131–425)

## 2023-05-22 LAB — HEMOGLOBIN A1C
Est. average glucose Bld gHb Est-mCnc: 120 mg/dL
Hgb A1c MFr Bld: 5.8 % — ABNORMAL HIGH (ref 4.8–5.6)

## 2023-05-24 NOTE — Assessment & Plan Note (Signed)
 Chronic, currently on metformin. I will check labs as below. She is encouraged to follow a low-glycemic diet, free of refined carbs.

## 2023-05-24 NOTE — Assessment & Plan Note (Signed)
 Chronic, managed with iron supplements. Adequate ferritin levels per hematology consultation. - Continue iron supplementation for now, may stop once results are available. - Monitor ferritin levels regularly

## 2023-05-24 NOTE — Assessment & Plan Note (Signed)
 Blood pressure within acceptable range post-hydrochlorothiazide  self discontinuation. Alternative antihypertensive options considered for pregnancy safety. - Discontinue hydrochlorothiazide  per patient - Advise regular home blood pressure monitoring. - Instruct to report if blood pressure consistently exceeds 130/80 mmHg. - Consider alternative antihypertensive medication if needed.

## 2023-05-24 NOTE — Assessment & Plan Note (Signed)
 Chronic, she has gained 18lbs since November 2024. She is encouraged to resume a regular exercise routine. Advised to aim for at least 150 minutes of exercise per week.

## 2023-05-24 NOTE — Assessment & Plan Note (Signed)
 Chronic, Hematology input appreciated. Most recent notes reviewed. She is encouraged to keep all f/u appointments.

## 2023-05-25 ENCOUNTER — Ambulatory Visit: Payer: Self-pay | Admitting: Internal Medicine

## 2023-08-01 ENCOUNTER — Ambulatory Visit: Payer: BC Managed Care – PPO | Admitting: Hematology

## 2023-08-01 ENCOUNTER — Other Ambulatory Visit: Payer: BC Managed Care – PPO

## 2023-08-02 ENCOUNTER — Inpatient Hospital Stay: Payer: BC Managed Care – PPO

## 2023-08-02 ENCOUNTER — Inpatient Hospital Stay: Payer: BC Managed Care – PPO | Admitting: Hematology

## 2023-08-13 ENCOUNTER — Other Ambulatory Visit: Payer: Self-pay | Admitting: Internal Medicine

## 2023-11-18 ENCOUNTER — Other Ambulatory Visit: Payer: Self-pay | Admitting: Internal Medicine

## 2023-11-20 ENCOUNTER — Ambulatory Visit: Payer: Self-pay | Admitting: Internal Medicine

## 2023-11-20 ENCOUNTER — Encounter: Payer: Self-pay | Admitting: Internal Medicine

## 2023-11-20 VITALS — BP 132/100 | HR 60 | Temp 98.3°F | Ht 72.0 in | Wt 322.0 lb

## 2023-11-20 DIAGNOSIS — Z23 Encounter for immunization: Secondary | ICD-10-CM | POA: Diagnosis not present

## 2023-11-20 DIAGNOSIS — Z0000 Encounter for general adult medical examination without abnormal findings: Secondary | ICD-10-CM | POA: Diagnosis not present

## 2023-11-20 DIAGNOSIS — I10 Essential (primary) hypertension: Secondary | ICD-10-CM

## 2023-11-20 DIAGNOSIS — N979 Female infertility, unspecified: Secondary | ICD-10-CM | POA: Diagnosis not present

## 2023-11-20 DIAGNOSIS — D500 Iron deficiency anemia secondary to blood loss (chronic): Secondary | ICD-10-CM | POA: Diagnosis not present

## 2023-11-20 LAB — POCT URINALYSIS DIP (CLINITEK)
Bilirubin, UA: NEGATIVE
Glucose, UA: NEGATIVE mg/dL
Ketones, POC UA: NEGATIVE mg/dL
Nitrite, UA: NEGATIVE
POC PROTEIN,UA: 30 — AB
Spec Grav, UA: 1.02 (ref 1.010–1.025)
Urobilinogen, UA: 0.2 U/dL
pH, UA: 7 (ref 5.0–8.0)

## 2023-11-20 MED ORDER — NIFEDIPINE ER OSMOTIC RELEASE 30 MG PO TB24: 30.0000 mg | ORAL_TABLET | Freq: Every day | ORAL | 1 refills | Status: DC

## 2023-11-20 NOTE — Assessment & Plan Note (Signed)
 BMI 43.  She is encouraged to initially strive for BMI less than 35 to decrease cardiac risk. Advised to aim for at least 150 minutes of exercise per week.

## 2023-11-20 NOTE — Assessment & Plan Note (Addendum)
 Chronic, uncontrolled.  Previously stopped hydrochlorothiazide  in hopes of getting pregnant. EKG performed, NSR w/o acute changes.  - Follow low sodium diet - Start nifedipine  30mg  daily. - Schedule NV in two weeks to reassess BP

## 2023-11-20 NOTE — Assessment & Plan Note (Signed)

## 2023-11-20 NOTE — Assessment & Plan Note (Addendum)
 She requests referral to reproductive endocrinologist. She states her GYN has not made this referral for her.  - She will call back and let us  know which MD she wants to see .

## 2023-11-20 NOTE — Patient Instructions (Signed)

## 2023-11-20 NOTE — Assessment & Plan Note (Signed)
 Chronic, managed with iron supplements.- Continue iron supplementation for now, may stop once results are available. - Monitor ferritin levels regularly

## 2023-11-20 NOTE — Progress Notes (Signed)
 Adrienne Benjamin, CMA,acting as a neurosurgeon for Adrienne LOISE Slocumb, MD.,have documented all relevant documentation on the behalf of Adrienne LOISE Slocumb, MD,as directed by  Adrienne LOISE Slocumb, MD while in the presence of Adrienne LOISE Slocumb, MD.  Subjective:    Patient ID: Adrienne Benjamin , female    DOB: Jun 06, 1980 , 43 y.o.   MRN: [id number]  Chief Complaint  Patient presents with   Annual Exam    She is here today for a full physical examination. She has her pap smears performed by Dr. Armond.  She is scheduled to see her next week.  She reports compliance with meds. She denies headaches, chest pain and shortness of breath. Patient reports she had a cold a week and she has been coughing a lot at night. She has been drinking tea and theraflu and hauls cough drops.    HPI Discussed the use of AI scribe software for clinical note transcription with the patient, who gave verbal consent to proceed.  History of Present Illness Adrienne Benjamin is a 43 year old female who presents for a physical exam and blood pressure check.  She is not currently on antihypertensive medication but was previously on hydrochlorothiazide , which she discontinued in an attempt to conceive. She has been experiencing increased stress, particularly since her mother has been hospitalized, which may be affecting her blood pressure.  Her mother has been hospitalized since October 22nd following surgery for hidradenitis suppurativa on her thigh. Post-surgery, her mother developed an infection at a nursing home, leading to her current hospitalization. This situation has been a significant source of stress for her.  She has lost eleven pounds since May, attributing this to stress and not focusing on eating. She moved in with her mother in late June or July to assist her, which has added to her stress.  She is currently taking iron supplements. She has not been able to visit other doctors due to her mother's situation but plans to start  addressing her own health needs in January.  She is responsible for walking a pit bull puppy, which adds to her daily activities. She describes herself as a 'cat person' and did not initially want the dog, which was acquired by her partner, Dorn.  She has been trying to contact her gynecologist, Dr. Noralyn, without success, and is considering finding a new doctor. Her last menstrual cycle was on October 23rd.   Hypertension This is a chronic problem. The current episode started more than 1 year ago. The problem has been gradually improving since onset. The problem is controlled. Pertinent negatives include no blurred vision. Risk factors for coronary artery disease include obesity and sedentary lifestyle. Past treatments include calcium channel blockers and diuretics. The current treatment provides moderate improvement. Compliance problems include exercise.      Past Medical History:  Diagnosis Date   Abnormal Pap smear 3/11   [facility]/ hpv   Anemia    Complex ovarian cyst    Endometriosis 3/07   Heavy menstrual bleeding    Hypertension    Insulin  resistance    last few years   Insulin  resistance      Family History  Problem Relation Age of Onset   Hypertension Mother    Anemia Mother    Diabetes Father    Anemia Sister      Current Outpatient Medications:    Ascorbic Acid (VITAMIN C) 100 MG tablet, Take 100 mg by mouth daily., Disp: , Rfl:  Cholecalciferol (VITAMIN D ) 50 MCG (2000 UT) CAPS, Take by mouth. daily, Disp: , Rfl:    co-enzyme Q-10 30 MG capsule, Take 30 mg by mouth 3 (three) times daily., Disp: , Rfl:    folic acid  (FOLVITE ) 1 MG tablet, Take 1 mg by mouth daily., Disp: , Rfl:    metFORMIN  (GLUCOPHAGE ) 500 MG tablet, TAKE 1 TABLET BY MOUTH THREE TIMES DAILY WITH MEALS, Disp: 270 tablet, Rfl: 0   NIFEdipine  (PROCARDIA -XL/NIFEDICAL-XL) 30 MG 24 hr tablet, Take 1 tablet (30 mg total) by mouth daily., Disp: 30 tablet, Rfl: 1   Prenatal Vit-Fe Fumarate-FA (PRENATAL  MULTIVITAMIN) TABS, Take 1 tablet by mouth daily., Disp: , Rfl:    vitamin B-12 (CYANOCOBALAMIN ) 100 MCG tablet, Take 100 mcg by mouth daily., Disp: , Rfl:    Allergies  Allergen Reactions   Ciprofloxacin Nausea And Vomiting      The patient states she uses none for birth control. Patient's last menstrual period was 10/25/2023.. Negative for Dysmenorrhea. Negative for: breast discharge, breast lump(s), breast pain and breast self exam. Associated symptoms include abnormal vaginal bleeding. Pertinent negatives include abnormal bleeding (hematology), anxiety, decreased libido, depression, difficulty falling sleep, dyspareunia, history of infertility, nocturia, sexual dysfunction, sleep disturbances, urinary incontinence, urinary urgency, vaginal discharge and vaginal itching. Diet regular.The patient states her exercise level is  minimal.  . The patient's tobacco use is:  Social History   Tobacco Use  Smoking Status Never  Smokeless Tobacco Never  . She has been exposed to passive smoke. The patient's alcohol use is:  Social History   Substance and Sexual Activity  Alcohol Use No   Review of Systems  Constitutional: Negative.   HENT: Negative.    Eyes: Negative.  Negative for blurred vision.  Respiratory: Negative.    Cardiovascular: Negative.   Gastrointestinal: Negative.   Endocrine: Negative.   Genitourinary: Negative.   Musculoskeletal: Negative.   Skin: Negative.   Allergic/Immunologic: Negative.   Neurological: Negative.   Hematological: Negative.   Psychiatric/Behavioral: Negative.       Today's Vitals   11/20/23 0845 11/20/23 0918  BP: (!) 160/110 (!) 132/100  Pulse: 60   Temp: 98.3 F (36.8 C)   TempSrc: Oral   Weight: (!) 322 lb (146.1 kg)   Height: 6' (1.829 m)   PainSc: 0-No pain    Body mass index is 43.67 kg/m.  Wt Readings from Last 3 Encounters:  11/20/23 (!) 322 lb (146.1 kg)  05/21/23 (!) 333 lb 6.4 oz (151.2 kg)  11/14/22 (!) 315 lb 12.8 oz  (143.2 kg)     Objective:  Physical Exam Vitals and nursing note reviewed.  Constitutional:      Appearance: Normal appearance. She is obese.  HENT:     Head: Normocephalic and atraumatic.     Right Ear: Tympanic membrane, ear canal and external ear normal.     Left Ear: Tympanic membrane, ear canal and external ear normal.     Nose: Nose normal.     Mouth/Throat:     Mouth: Mucous membranes are moist.     Pharynx: Oropharynx is clear.  Eyes:     Extraocular Movements: Extraocular movements intact.     Conjunctiva/sclera: Conjunctivae normal.     Pupils: Pupils are equal, round, and reactive to light.  Cardiovascular:     Rate and Rhythm: Normal rate and regular rhythm.     Pulses:          Dorsalis pedis pulses are 3+ on the right  side and 3+ on the left side.     Heart sounds: Normal heart sounds.  Pulmonary:     Effort: Pulmonary effort is normal.     Breath sounds: Normal breath sounds.  Chest:  Breasts:    Tanner Score is 5.     Right: Normal.     Left: Normal.  Abdominal:     General: Bowel sounds are normal. There is no distension.     Palpations: Abdomen is soft.     Tenderness: There is no abdominal tenderness.     Comments: Rounded, soft.   Genitourinary:    Comments: deferred Musculoskeletal:        General: Normal range of motion.     Cervical back: Normal range of motion and neck supple.  Skin:    General: Skin is warm and dry.  Neurological:     General: No focal deficit present.     Mental Status: She is alert and oriented to person, place, and time.  Psychiatric:        Mood and Affect: Mood normal.        Behavior: Behavior normal.      Assessment And Plan:     Annual physical exam Assessment & Plan: A full exam was performed.  Importance of monthly self breast exams was discussed with the patient.  She is advised to get 30-45 minutes of regular exercise, no less than four to five days per week. Both weight-bearing and aerobic exercises are  recommended.  She is advised to follow a healthy diet with at least six fruits/veggies per day, decrease intake of red meat and other saturated fats and to increase fish intake to twice weekly.  Meats/fish should not be fried -- baked, boiled or broiled is preferable. It is also important to cut back on your sugar intake.  Be sure to read labels - try to avoid anything with added sugar, high fructose corn syrup or other sweeteners.  If you must use a sweetener, you can try stevia or monkfruit.  It is also important to avoid artificially sweetened foods/beverages and diet drinks. Lastly, wear SPF 50 sunscreen on exposed skin and when in direct sunlight for an extended period of time.  Be sure to avoid fast food restaurants and aim for at least 60 ounces of water daily.      Orders: -     CBC -     CMP14+EGFR -     Lipid panel -     Hemoglobin A1c  Essential hypertension, benign Assessment & Plan: Chronic, uncontrolled.  Previously stopped hydrochlorothiazide  in hopes of getting pregnant. EKG performed, NSR w/o acute changes.  - Follow low sodium diet - Start nifedipine  30mg  daily. - Schedule NV in two weeks to reassess BP  Orders: -     Microalbumin / creatinine urine ratio -     EKG 12-Lead -     POCT URINALYSIS DIP (CLINITEK)  Iron deficiency anemia due to chronic blood loss Assessment & Plan: Chronic, managed with iron supplements.- Continue iron supplementation for now, may stop once results are available. - Monitor ferritin levels regularly  Orders: -     Iron, TIBC and Ferritin Panel  Infertility, female Assessment & Plan: She requests referral to reproductive endocrinologist. She states her GYN has not made this referral for her.  - She will call back and let us  know which MD she wants to see .   Morbid obesity due to excess calories [facility]) Assessment & Plan:  BMI 43.  She is encouraged to initially strive for BMI less than 35 to decrease cardiac risk. Advised to aim for at  least 150 minutes of exercise per week.    Need for influenza vaccination -     Flu vaccine trivalent PF, 6mos and older(Flulaval,Afluria,Fluarix,Fluzone)  Other orders -     NIFEdipine  ER Osmotic Release; Take 1 tablet (30 mg total) by mouth daily.  Dispense: 30 tablet; Refill: 1    Return in 2 weeks (on 12/04/2023), or bp check - NV, for 1 year physical, 6 month bp. Patient was given opportunity to ask questions. Patient verbalized understanding of the plan and was able to repeat key elements of the plan. All questions were answered to their satisfaction.   I, Adrienne LOISE Slocumb, MD, have reviewed all documentation for this visit. The documentation on 11/20/23 for the exam, diagnosis, procedures, and orders are all accurate and complete.

## 2023-11-21 LAB — CMP14+EGFR
ALT: 6 IU/L (ref 0–32)
AST: 18 IU/L (ref 0–40)
Albumin: 3.9 g/dL (ref 3.9–4.9)
Alkaline Phosphatase: 92 IU/L (ref 41–116)
BUN/Creatinine Ratio: 8 — ABNORMAL LOW (ref 9–23)
BUN: 5 mg/dL — ABNORMAL LOW (ref 6–24)
Bilirubin Total: 0.6 mg/dL (ref 0.0–1.2)
CO2: 20 mmol/L (ref 20–29)
Calcium: 9.3 mg/dL (ref 8.7–10.2)
Chloride: 103 mmol/L (ref 96–106)
Creatinine, Ser: 0.63 mg/dL (ref 0.57–1.00)
Globulin, Total: 3.4 g/dL (ref 1.5–4.5)
Glucose: 89 mg/dL (ref 70–99)
Potassium: 4.4 mmol/L (ref 3.5–5.2)
Sodium: 140 mmol/L (ref 134–144)
Total Protein: 7.3 g/dL (ref 6.0–8.5)
eGFR: 114 mL/min/1.73 (ref >59)

## 2023-11-21 LAB — IRON,TIBC AND FERRITIN PANEL
Ferritin: 33 ng/mL (ref 15–150)
Iron Saturation: 23 % (ref 15–55)
Iron: 82 ug/dL (ref 27–159)
Total Iron Binding Capacity: 363 ug/dL (ref 250–450)
UIBC: 281 ug/dL (ref 131–425)

## 2023-11-21 LAB — LIPID PANEL
Chol/HDL Ratio: 2.6 ratio (ref 0.0–4.4)
Cholesterol, Total: 169 mg/dL (ref 100–199)
HDL: 65 mg/dL (ref >39)
LDL Chol Calc (NIH): 91 mg/dL (ref 0–99)
Triglycerides: 68 mg/dL (ref 0–149)
VLDL Cholesterol Cal: 13 mg/dL (ref 5–40)

## 2023-11-21 LAB — CBC
Hematocrit: 34.8 % (ref 34.0–46.6)
Hemoglobin: 10.7 g/dL — ABNORMAL LOW (ref 11.1–15.9)
MCH: 23.1 pg — ABNORMAL LOW (ref 26.6–33.0)
MCHC: 30.7 g/dL — ABNORMAL LOW (ref 31.5–35.7)
MCV: 75 fL — ABNORMAL LOW (ref 79–97)
Platelets: 340 x10E3/uL (ref 150–450)
RBC: 4.63 x10E6/uL (ref 3.77–5.28)
RDW: 17.9 % — ABNORMAL HIGH (ref 11.7–15.4)
WBC: 3.5 x10E3/uL (ref 3.4–10.8)

## 2023-11-21 LAB — MICROALBUMIN / CREATININE URINE RATIO
Creatinine, Urine: 178.6 mg/dL
Microalb/Creat Ratio: 9 mg/g{creat} (ref 0–29)
Microalbumin, Urine: 16.3 ug/mL

## 2023-11-21 LAB — HEMOGLOBIN A1C
Est. average glucose Bld gHb Est-mCnc: 117 mg/dL
Hgb A1c MFr Bld: 5.7 % — ABNORMAL HIGH (ref 4.8–5.6)

## 2023-12-04 ENCOUNTER — Ambulatory Visit

## 2023-12-04 VITALS — BP 126/86 | HR 85 | Temp 98.8°F | Ht 72.0 in | Wt 322.0 lb

## 2023-12-04 DIAGNOSIS — I10 Essential (primary) hypertension: Secondary | ICD-10-CM

## 2023-12-04 NOTE — Patient Instructions (Signed)
 Hypertension, Adult Hypertension is another name for high blood pressure. High blood pressure forces your heart to work harder to pump blood. This can cause problems over time. There are two numbers in a blood pressure reading. There is a top number (systolic) over a bottom number (diastolic). It is best to have a blood pressure that is below 120/80. What are the causes? The cause of this condition is not known. Some other conditions can lead to high blood pressure. What increases the risk? Some lifestyle factors can make you more likely to develop high blood pressure: Smoking. Not getting enough exercise or physical activity. Being overweight. Having too much fat, sugar, calories, or salt (sodium) in your diet. Drinking too much alcohol. Other risk factors include: Having any of these conditions: Heart disease. Diabetes. High cholesterol. Kidney disease. Obstructive sleep apnea. Having a family history of high blood pressure and high cholesterol. Age. The risk increases with age. Stress. What are the signs or symptoms? High blood pressure may not cause symptoms. Very high blood pressure (hypertensive crisis) may cause: Headache. Fast or uneven heartbeats (palpitations). Shortness of breath. Nosebleed. Vomiting or feeling like you may vomit (nauseous). Changes in how you see. Very bad chest pain. Feeling dizzy. Seizures. How is this treated? This condition is treated by making healthy lifestyle changes, such as: Eating healthy foods. Exercising more. Drinking less alcohol. Your doctor may prescribe medicine if lifestyle changes do not help enough and if: Your top number is above 130. Your bottom number is above 80. Your personal target blood pressure may vary. Follow these instructions at home: Eating and drinking  If told, follow the DASH eating plan. To follow this plan: Fill one half of your plate at each meal with fruits and vegetables. Fill one fourth of your plate  at each meal with whole grains. Whole grains include whole-wheat pasta, brown rice, and whole-grain bread. Eat or drink low-fat dairy products, such as skim milk or low-fat yogurt. Fill one fourth of your plate at each meal with low-fat (lean) proteins. Low-fat proteins include fish, chicken without skin, eggs, beans, and tofu. Avoid fatty meat, cured and processed meat, or chicken with skin. Avoid pre-made or processed food. Limit the amount of salt in your diet to less than 1,500 mg each day. Do not drink alcohol if: Your doctor tells you not to drink. You are pregnant, may be pregnant, or are planning to become pregnant. If you drink alcohol: Limit how much you have to: 0-1 drink a day for women. 0-2 drinks a day for men. Know how much alcohol is in your drink. In the [place] one drink equals one 12 oz bottle of beer (355 mL), one 5 oz glass of wine (148 mL), or one 1 oz glass of hard liquor (44 mL). Lifestyle  Work with your doctor to stay at a healthy weight or to lose weight. Ask your doctor what the best weight is for you. Get at least 30 minutes of exercise that causes your heart to beat faster (aerobic exercise) most days of the week. This may include walking, swimming, or biking. Get at least 30 minutes of exercise that strengthens your muscles (resistance exercise) at least 3 days a week. This may include lifting weights or doing Pilates. Do not smoke or use any products that contain nicotine or tobacco. If you need help quitting, ask your doctor. Check your blood pressure at home as told by your doctor. Keep all follow-up visits. Medicines Take over-the-counter and prescription medicines  only as told by your doctor. Follow directions carefully. Do not skip doses of blood pressure medicine. The medicine does not work as well if you skip doses. Skipping doses also puts you at risk for problems. Ask your doctor about side effects or reactions to medicines that you should watch  for. Contact a doctor if: You think you are having a reaction to the medicine you are taking. You have headaches that keep coming back. You feel dizzy. You have swelling in your ankles. You have trouble with your vision. Get help right away if: You get a very bad headache. You start to feel mixed up (confused). You feel weak or numb. You feel faint. You have very bad pain in your: Chest. Belly (abdomen). You vomit more than once. You have trouble breathing. These symptoms may be an emergency. Get help right away. Call 911. Do not wait to see if the symptoms will go away. Do not drive yourself to the hospital. Summary Hypertension is another name for high blood pressure. High blood pressure forces your heart to work harder to pump blood. For most people, a normal blood pressure is less than 120/80. Making healthy choices can help lower blood pressure. If your blood pressure does not get lower with healthy choices, you may need to take medicine. This information is not intended to replace advice given to you by your health care provider. Make sure you discuss any questions you have with your health care provider. Document Revised: 10/07/2020 Document Reviewed: 10/07/2020 Elsevier Patient Education  2024 [employer].

## 2023-12-04 NOTE — Progress Notes (Signed)
 Patient presents today for a bpc. Patient is taking nifedipine  XL 30mg . Patient reports she taking her nifedipine  in the mornings. I checked her blood pressure and it is 126/82 P85. I had the patient wait 10 minutes and her blood pressure was 126/86 P85. Patient's blood pressure was checked with a thigh cuff. She was advised to continue her current medication. She was also advised to exercise at least 150 minutes per week. Patient was advised the goal bp is under 130/80. Patient was advised to keep her next appointment in April as scheduled for a bpc. YL,RMA    BP Readings from Last 3 Encounters:  11/20/23 (!) 132/100  05/21/23 126/80  11/14/22 132/84

## 2024-01-11 ENCOUNTER — Other Ambulatory Visit: Payer: Self-pay | Admitting: Internal Medicine

## 2024-02-07 ENCOUNTER — Other Ambulatory Visit: Payer: Self-pay | Admitting: Internal Medicine

## 2024-04-08 ENCOUNTER — Ambulatory Visit: Admitting: Internal Medicine

## 2024-04-10 ENCOUNTER — Ambulatory Visit: Admitting: Internal Medicine

## 2024-05-20 ENCOUNTER — Ambulatory Visit: Payer: Self-pay | Admitting: Internal Medicine

## 2024-11-25 ENCOUNTER — Encounter: Payer: Self-pay | Admitting: Internal Medicine
